# Patient Record
Sex: Male | Born: 1965
Health system: Southern US, Community
[De-identification: ages and names within clinical notes are randomized; demographics above are authoritative.]

## PROBLEM LIST (undated history)

## (undated) DIAGNOSIS — D801 Nonfamilial hypogammaglobulinemia: Secondary | ICD-10-CM

## (undated) DIAGNOSIS — F319 Bipolar disorder, unspecified: Secondary | ICD-10-CM

## (undated) DIAGNOSIS — K219 Gastro-esophageal reflux disease without esophagitis: Secondary | ICD-10-CM

## (undated) DIAGNOSIS — I10 Essential (primary) hypertension: Secondary | ICD-10-CM

## (undated) HISTORY — DX: Bipolar disorder, unspecified: F31.9

## (undated) HISTORY — DX: Nonfamilial hypogammaglobulinemia: D80.1

## (undated) HISTORY — PX: TONSILLECTOMY: SUR1361

---

## 2004-06-19 ENCOUNTER — Emergency Department (HOSPITAL_COMMUNITY): Admission: EM | Admit: 2004-06-19 | Discharge: 2004-06-20 | Payer: Self-pay | Admitting: Emergency Medicine

## 2009-01-20 ENCOUNTER — Encounter: Payer: Self-pay | Admitting: Family Medicine

## 2009-05-30 ENCOUNTER — Ambulatory Visit: Payer: Self-pay | Admitting: Family Medicine

## 2009-05-30 DIAGNOSIS — D801 Nonfamilial hypogammaglobulinemia: Secondary | ICD-10-CM

## 2009-05-30 HISTORY — DX: Nonfamilial hypogammaglobulinemia: D80.1

## 2009-05-31 ENCOUNTER — Encounter: Payer: Self-pay | Admitting: Family Medicine

## 2009-05-31 LAB — CONVERTED CEMR LAB
IgA: 108 mg/dL (ref 68–378)
IgG (Immunoglobin G), Serum: 687 mg/dL — ABNORMAL LOW (ref 694–1618)
IgM, Serum: 62 mg/dL (ref 60–263)

## 2009-07-28 ENCOUNTER — Encounter: Payer: Self-pay | Admitting: Family Medicine

## 2009-11-29 ENCOUNTER — Ambulatory Visit: Payer: Self-pay | Admitting: Family Medicine

## 2009-11-29 DIAGNOSIS — F319 Bipolar disorder, unspecified: Secondary | ICD-10-CM

## 2009-11-29 HISTORY — DX: Bipolar disorder, unspecified: F31.9

## 2009-11-30 LAB — CONVERTED CEMR LAB
ALT: 33 units/L (ref 0–53)
BUN: 14 mg/dL (ref 6–23)
CO2: 28 meq/L (ref 19–32)
Cholesterol: 179 mg/dL (ref 0–200)
Creatinine, Ser: 0.94 mg/dL (ref 0.40–1.50)
Free Thyroxine Index: 3.1 (ref 1.0–3.9)
Glucose, Bld: 95 mg/dL (ref 70–99)
HDL: 43 mg/dL (ref >39)
Total Bilirubin: 0.7 mg/dL (ref 0.3–1.2)
Total CHOL/HDL Ratio: 4.2
Triglycerides: 88 mg/dL (ref <150)
VLDL: 18 mg/dL (ref 0–40)

## 2010-02-21 NOTE — Assessment & Plan Note (Signed)
Summary: CPE   Vital Signs:  Patient profile:   45 year old male Height:      70 inches Weight:      189 pounds BMI:     27.22 O2 Sat:      99 % on Room air Pulse rate:   62 / minute BP sitting:   128 / 88  (left arm) Cuff size:   large  Vitals Entered By: Payton Spark CMA (November 29, 2009 10:36 AM)  O2 Flow:  Room air CC: CPE w/ fasting labs   Primary Care Provider:  Seymour Bars DO  CC:  CPE w/ fasting labs.  History of Present Illness: 45 yo WM presents for CPE.  He has bipolar d/o managed by psychiatry, stable and is due for fasting labs and a lithium level today.  Immunizations UTD.  Sees derm annually for skin check.  Denies fam hx of colon or prostate cancer or of premature heart dz.  Current Medications (verified): 1)  Lamictal 150 Mg Tabs (Lamotrigine) .... Take 1 Tab By Mouth Two Times A Day 2)  Lithium Carbonate 450 Mg Cr-Tabs (Lithium Carbonate) .... Take 1 Tab By Mouth Two Times A Day 3)  Wellbutrin Xl 150 Mg Xr24h-Tab (Bupropion Hcl) .... Take 1 Tab By Mouth Once Daily 4)  Propranolol Hcl 10 Mg Tabs (Propranolol Hcl) .... Take 1 Tab By Mouth Every Morning and 2 Tabs By Mouth Every Evening 5)  Dicyclomine Hcl 20 Mg Tabs (Dicyclomine Hcl) .... Take 1 Tab By Mouth W/ Meals  Allergies (verified): 1)  ! * D.m. Cold Medicines 2)  ! Pcn 3)  ! Steroids 4)  ! Barbiturates  Past History:  Past Medical History: Reviewed history from 05/30/2009 and no changes required. Bipolar D/O since age 44.  psychiatrist: Dr Asher Muir in [place] tremors - on BB  Past Surgical History: Reviewed history from 05/30/2009 and no changes required. tonsillectomy  Family History: Reviewed history from 05/30/2009 and no changes required. father SCC; bipolar Mother healthy brother SCC sister healthy  Social History: Reviewed history from 05/30/2009 and no changes required. 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  Has 5 kids.  4 live with ex- wife and 1 daughter (1 yo) lives with him and  his wife.  Quit smoking in 2000. Walks for exercise.  Review of Systems  The patient denies anorexia, fever, weight loss, weight gain, vision loss, decreased hearing, hoarseness, chest pain, syncope, dyspnea on exertion, peripheral edema, prolonged cough, headaches, hemoptysis, abdominal pain, melena, hematochezia, severe indigestion/heartburn, hematuria, incontinence, genital sores, muscle weakness, suspicious skin lesions, transient blindness, difficulty walking, depression, unusual weight change, abnormal bleeding, enlarged lymph nodes, angioedema, breast masses, and testicular masses.    Physical Exam  General:  alert, well-developed, well-nourished, and well-hydrated.   Head:  normocephalic and atraumatic.   Eyes:  pupils equal, pupils round, and pupils reactive to light.   Ears:  EACs patent; TMs translucent and gray with good cone of light and bony landmarks.  Nose:  no nasal discharge.   Mouth:  good dentition and pharynx pink and moist.   Neck:  no masses.   Lungs:  Normal respiratory effort, chest expands symmetrically. Lungs are clear to auscultation, no crackles or wheezes. Heart:  Normal rate and regular rhythm. S1 and S2 normal without gallop, murmur, click, rub or other extra sounds. Abdomen:  Bowel sounds positive,abdomen soft and non-tender without masses, organomegaly; no AA bruits Pulses:  2+ radial and pedal pulses Extremities:  no LE edema Neurologic:  gait normal.   Skin:  color normal, no rashes, and no suspicious lesions.   Cervical Nodes:  No lymphadenopathy noted Psych:  good eye contact, not anxious appearing, and not depressed appearing.     Impression & Recommendations:  Problem # 1:  HEALTH SCREENING (ICD-V70.0) Keeping healthy checklist for men reviewed. Update fasting labs. BMI 27 = overwt.  BP at goal. F/U with psych for bipolar d/o. MVI daily. healthy diet, regular exercise encouraged.  Complete Medication List: 1)  Lamictal 150 Mg Tabs  (Lamotrigine) .... Take 1 tab by mouth two times a day 2)  Lithium Carbonate 450 Mg Cr-tabs (Lithium carbonate) .... Take 1 tab by mouth two times a day 3)  Wellbutrin Xl 150 Mg Xr24h-tab (Bupropion hcl) .... Take 1 tab by mouth once daily 4)  Propranolol Hcl 10 Mg Tabs (Propranolol hcl) .... Take 1 tab by mouth every morning and 2 tabs by mouth every evening 5)  Dicyclomine Hcl 20 Mg Tabs (Dicyclomine hcl) .... Take 1 tab by mouth w/ meals  Other Orders: T-Lithium Level ([id number]) T-Lipid Profile ([id number]) T-Comprehensive Metabolic Panel [phone]) T-Thyroid Profile [phone])  Patient Instructions: 1)  Stay on current meds. 2)  Labs today. 3)  Will call you w/ results tomorrow. 4)  REturn for follow up in 1 yr, sooner if needed.   Orders Added: 1)  T-Lithium Level [[id number]] 2)  T-Lipid Profile [[id number]] 3)  T-Comprehensive Metabolic Panel [[id number]] 4)  T-Thyroid Profile [phone] 5)  Est. Patient age 70-64 [[id number]]   Immunization History:  Tetanus/Td Immunization History:    Tetanus/Td:  historical (03/22/2009)   Immunization History:  Tetanus/Td Immunization History:    Tetanus/Td:  Historical (03/22/2009)

## 2010-02-21 NOTE — Assessment & Plan Note (Signed)
Summary: NOV abnormal lab   Vital Signs:  Patient profile:   45 year old male Height:      70 inches Weight:      187 pounds BMI:     26.93 O2 Sat:      98 % on Room air Pulse rate:   71 / minute BP sitting:   113 / 75  (left arm) Cuff size:   large  Vitals Entered By: Payton Spark CMA (May 30, 2009 3:37 PM)  O2 Flow:  Room air CC: New to est. Denied life insurance due to ? globulin.    Primary Care Provider:  Seymour Bars DO  CC:  New to est. Denied life insurance due to ? globulin. Marland Kitchen  History of Present Illness: 45 yo WM presents for NOV.  He did not have a PCP.  In Dec, he had labs done thru [facility] and his 'globulin' level was a little low.  Otherwise, everything looked normal.  He is seeing psych in [place] for bipolar disorder.  he does have yearlong allergies.     Current Medications (verified): 1)  Lamictal 150 Mg Tabs (Lamotrigine) .... Take 1 Tab By Mouth Two Times A Day 2)  Lithium Carbonate 450 Mg Cr-Tabs (Lithium Carbonate) .... Take 1 Tab By Mouth Two Times A Day 3)  Wellbutrin Xl 150 Mg Xr24h-Tab (Bupropion Hcl) .... Take 1 Tab By Mouth Once Daily 4)  Propranolol Hcl 10 Mg Tabs (Propranolol Hcl) .... Take 1 Tab By Mouth Every Morning and 2 Tabs By Mouth Every Evening 5)  Dicyclomine Hcl 20 Mg Tabs (Dicyclomine Hcl) .... Take 1 Tab By Mouth W/ Meals  Allergies (verified): 1)  ! * D.m. Cold Medicines 2)  ! Pcn 3)  ! Steroids 4)  ! Barbiturates  Past History:  Past Medical History: Bipolar D/O since age 75.  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  Has 5 kids.  4 live with ex- wife and 1 daughter (1 yo) lives with him and his wife.  Quit smoking in 2000. Walks for exercise.  Review of Systems       no fevers/sweats/weakness, unexplained wt loss/gain, no change in  vision, no difficulty hearing, ringing in ears, no hay fever/allergies, no CP/discomfort, no palpitations, no breast lump/nipple discharge, no cough/wheeze, no blood in stool, no N/V/D, no nocturia, no leaking urine, no unusual vag bleeding, no vaginal/penile discharge, no muscle/joint pain, no rash, no new/changing mole, no HA, no memory loss, no anxiety, no sleep problem, + depression, no unexplained lumps, no easy bruising/bleeding, no concern with sexual function   Physical Exam  General:  alert, well-developed, well-nourished, and well-hydrated.   Head:  normocephalic and atraumatic.   Mouth:  good dentition and pharynx pink and moist.   Neck:  no masses.   Lungs:  Normal respiratory effort, chest expands symmetrically. Lungs are clear to auscultation, no crackles or wheezes. Heart:  Normal rate and regular rhythm. S1 and S2 normal without gallop, murmur, click, rub or other extra sounds. Skin:  color normal.   Cervical Nodes:  No lymphadenopathy noted Psych:  good eye contact, not anxious appearing, and not depressed appearing.     Impression & Recommendations:  Problem # 1:  UNSPECIFIED HYPOGAMMAGLOBULINEMIA (ICD-279.00) Globulin borderline low.  Recheck Quant IGs today to send back to [facility].  Pt  is otherwise healthy and normal and his CMP/ FLP were normal.   Orders: T-Immunoglobulins, Quantitative ([id number])  Complete Medication List: 1)  Lamictal 150 Mg Tabs (Lamotrigine) .... Take 1 tab by mouth two times a day 2)  Lithium Carbonate 450 Mg Cr-tabs (Lithium carbonate) .... Take 1 tab by mouth two times a day 3)  Wellbutrin Xl 150 Mg Xr24h-tab (Bupropion hcl) .... Take 1 tab by mouth once daily 4)  Propranolol Hcl 10 Mg Tabs (Propranolol hcl) .... Take 1 tab by mouth every morning and 2 tabs by mouth every evening 5)  Dicyclomine Hcl 20 Mg Tabs (Dicyclomine hcl) .... Take 1 tab by mouth w/ meals  Patient Instructions: 1)  Lab downstairs today. 2)  Will call you w/ results in  2-3 days. 3)  Will send you a copy to send to [facility]. 4)  Return for a PHYSICAL in 6 mos.

## 2010-02-21 NOTE — Letter (Signed)
Summary: [facility]   Imported By: Lanelle Bal 08/09/2009 14:34:03  _____________________________________________________________________  External Attachment:    Type:   Image     Comment:   External Document

## 2010-07-19 ENCOUNTER — Telehealth: Payer: Self-pay | Admitting: Family Medicine

## 2010-07-19 NOTE — Telephone Encounter (Signed)
[facility] Jasmine December) called and pt was seen in the [facility]) weeks ago.  They are calling for CD and xray report that we should have from the [facility].   Plan:  Called and told Jasmine December that Payton Spark, CMA asst for Dr. Cathey Endow has not seen those reports come across.  Told them they would need to call the [facility] to retrieve them. Jarvis Newcomer, LPN Dom[facility] Dimes

## 2012-04-09 ENCOUNTER — Ambulatory Visit (INDEPENDENT_AMBULATORY_CARE_PROVIDER_SITE_OTHER): Payer: BC Managed Care – PPO | Admitting: Family Medicine

## 2012-04-09 ENCOUNTER — Encounter: Payer: Self-pay | Admitting: Family Medicine

## 2012-04-09 VITALS — BP 117/70 | HR 80 | Temp 97.9°F | Wt 190.0 lb

## 2012-04-09 DIAGNOSIS — IMO0002 Reserved for concepts with insufficient information to code with codable children: Secondary | ICD-10-CM

## 2012-04-09 DIAGNOSIS — R3589 Other polyuria: Secondary | ICD-10-CM | POA: Insufficient documentation

## 2012-04-09 DIAGNOSIS — R358 Other polyuria: Secondary | ICD-10-CM

## 2012-04-09 DIAGNOSIS — S76219A Strain of adductor muscle, fascia and tendon of unspecified thigh, initial encounter: Secondary | ICD-10-CM

## 2012-04-09 DIAGNOSIS — L738 Other specified follicular disorders: Secondary | ICD-10-CM

## 2012-04-09 DIAGNOSIS — L739 Follicular disorder, unspecified: Secondary | ICD-10-CM

## 2012-04-09 MED ORDER — SULFAMETHOXAZOLE-TRIMETHOPRIM 800-160 MG PO TABS: ORAL_TABLET | ORAL | Status: AC

## 2012-04-09 NOTE — Progress Notes (Signed)
CC: Roger Gonzalez is a 47 y.o. male is here for Groin Pain and Urinary Frequency   Subjective: HPI:  Patient complains of urinary frequency. This is been present for matter of years. It has not gotten better or worse since onset. Onset was gradual. Symptoms are worse with increased fluids during the day. Symptoms improved but he drinks less than 1 gallon of water a day. He admits to drinking 1 gallon of water most days of the week. He denies week stream, sensation of incomplete voiding, hesitancy, dysuria, denies ever waking even once to urinate at night.  Patient complains of left groin pain is been present for 2-3 months. Symptoms came on gradually without any precipitating event. They are worse when doing stomach crunches or bearing down to have a hard bowel movement. Denies pain with coughing, sneezing, laughing, nor lifting objects greater than 25 pounds. Denies swelling in the groin nor testicular pain nor testicular swelling. Symptoms come and go without warning on a weekly basis. Symptoms are mild in severity at the maximum.  Patient complains of perirectal discomfort described as a soreness and rawness. It is worse when wiping his bottom, he sometimes notices scant streaks of bright red blood" paper the more he wipes. The above discomfort is described as mild/moderate in severity and worse when the pararectal area is moist or when sitting for long periods of time. He's never had this before. Interventions have included coconut oil without much improvement of discomfort. He denies painful bowel movements nor blood or tar-like stools. Denies recent or remote trauma to pararectal area  Denies fevers, chills, nausea, abdominal pain, pelvic pain  Review Of Systems Outlined In HPI  Past Medical History  Diagnosis Date  . BIPOLAR DISORDER UNSPECIFIED 11/29/2009  . UNSPECIFIED HYPOGAMMAGLOBULINEMIA 05/30/2009    Qualifier: Diagnosis of  By: Thomos Lemons       History reviewed. No  pertinent family history.   History  Substance Use Topics  . Smoking status: Not on file  . Smokeless tobacco: Not on file  . Alcohol Use: Not on file     Objective: Filed Vitals:   04/09/12 1617  BP: 117/70  Pulse: 80  Temp: 97.9 F (36.6 C)    General: Alert and Oriented, No Acute Distress HEENT: Pupils equal, round, reactive to light. Conjunctivae clear.  Moist mucous membranes Lungs: Clear to auscultation bilaterally, no wheezing/ronchi/rales.  Comfortable work of breathing. Good air movement. Cardiac: Regular rate and rhythm. Normal S1/S2.  No murmurs, rubs, nor gallops.   Abdomen: Soft and nontender Genitourinary: No testicular swelling or tenderness, no palpable mass or bulge in the left inguinal canal, no bulging nor masses in or around the left inguinal canal with Valsalva Rectal: External rectal exam shows trace to mild folliculitis 1-2 cm just anterior to the anus without abnormalities otherwise Extremities: No peripheral edema.  Strong peripheral pulses.  Mental Status: No depression, anxiety, nor agitation. Skin: Warm and dry.  Assessment & Plan: Roger Gonzalez was seen today for groin pain and urinary frequency.  Diagnoses and associated orders for this visit:  Folliculitis - sulfamethoxazole-trimethoprim (SEPTRA DS) 800-160 MG per tablet; One by mouth twice a day for ten days.  Polyuria - Cancel: BASIC METABOLIC PANEL WITH GFR  Groin strain, initial encounter  Other Orders - BuPROPion HCl (WELLBUTRIN PO); Take by mouth. - LITHIUM CARBONATE PO; Take by mouth. - PROPRANOLOL HCL PO; Take by mouth.    Folliculitis: Discussed with patient that perirectal folliculitis most likely causing his discomfort,  treatment would include keeping area clean and dry along with starting Bactrim.Signs and symptoms requring emergent/urgent reevaluation were discussed with the patient. Polyuria: Encouraged patient to have urinalysis and basic metabolic panel to rule out  hyperglycemia, patient would prefer to wait for CPE, discussed possible side effect of lithium Groin strain: Patient was given a handout and exercises were demonstrated to help him with home rehabilitation plan  Return in about 4 weeks (around 05/07/2012) for cpe.

## 2012-04-23 ENCOUNTER — Telehealth: Payer: Self-pay | Admitting: Family Medicine

## 2012-04-23 ENCOUNTER — Encounter: Payer: BC Managed Care – PPO | Admitting: Family Medicine

## 2012-04-23 DIAGNOSIS — Z79899 Other long term (current) drug therapy: Secondary | ICD-10-CM | POA: Insufficient documentation

## 2012-04-23 DIAGNOSIS — Z13220 Encounter for screening for lipoid disorders: Secondary | ICD-10-CM

## 2012-04-23 DIAGNOSIS — R358 Other polyuria: Secondary | ICD-10-CM

## 2012-04-23 NOTE — Telephone Encounter (Signed)
[place], Labs regarding annual physical and screening for lithium side effects placed at front desk.  These should be obtained fasting for most accurate results.

## 2012-05-02 LAB — URINALYSIS
Bilirubin Urine: NEGATIVE
Glucose, UA: NEGATIVE mg/dL
Hgb urine dipstick: NEGATIVE
Ketones, ur: NEGATIVE mg/dL
Protein, ur: NEGATIVE mg/dL

## 2012-05-02 LAB — CBC
MCV: 87.6 fL (ref 78.0–100.0)
Platelets: 172 10*3/uL (ref 150–400)
RBC: 5.39 MIL/uL (ref 4.22–5.81)
WBC: 5.2 10*3/uL (ref 4.0–10.5)

## 2012-05-02 LAB — COMPLETE METABOLIC PANEL WITH GFR
CO2: 26 mEq/L (ref 19–32)
Calcium: 9.4 mg/dL (ref 8.4–10.5)
Chloride: 108 mEq/L (ref 96–112)
Creat: 0.96 mg/dL (ref 0.50–1.35)
GFR, Est African American: >89 mL/min
GFR, Est Non African American: >89 mL/min
Glucose, Bld: 97 mg/dL (ref 70–99)
Total Bilirubin: 0.6 mg/dL (ref 0.3–1.2)

## 2012-05-02 LAB — LIPID PANEL
LDL Cholesterol: 132 mg/dL — ABNORMAL HIGH (ref 0–99)
Total CHOL/HDL Ratio: 4.9 Ratio
VLDL: 13 mg/dL (ref 0–40)

## 2012-05-04 LAB — THYROID PANEL WITH TSH
Free Thyroxine Index: 4.1 — ABNORMAL HIGH (ref 1.0–3.9)
T3 Uptake: 36.3 % (ref 22.5–37.0)
T4, Total: 11.2 ug/dL (ref 5.0–12.5)
TSH: 0.929 u[IU]/mL (ref 0.350–4.500)

## 2012-05-05 ENCOUNTER — Encounter: Payer: Self-pay | Admitting: Family Medicine

## 2012-05-05 ENCOUNTER — Ambulatory Visit (INDEPENDENT_AMBULATORY_CARE_PROVIDER_SITE_OTHER): Payer: BC Managed Care – PPO | Admitting: Family Medicine

## 2012-05-05 VITALS — BP 104/64 | HR 82 | Ht 70.0 in | Wt 188.0 lb

## 2012-05-05 DIAGNOSIS — Z0000 Encounter for general adult medical examination without abnormal findings: Secondary | ICD-10-CM

## 2012-05-05 DIAGNOSIS — E785 Hyperlipidemia, unspecified: Secondary | ICD-10-CM | POA: Insufficient documentation

## 2012-05-05 NOTE — Progress Notes (Signed)
CC: Roger Gonzalez is a 47 y.o. male is here for Annual Exam   Subjective: HPI:  Colonoscopy: IBS workup, this occured within last 10 years, he denies any abnormalities on this procedure. Denies recent or remote car appearance stool nor blood in stool. Prostate: Discussed screening risks/beneifts with patient on 05/05/2012. No personal or family history of prostate cancer  Influenza Vaccine:  Out of season Pneumovax: not indicated until 28 Td/Tdap: Td 2011 therefore up-to-date Zoster: Not indicated until 35  Patient presents for complete physical exam without any complaints today.  Exercises on a daily basis, eats a healthy varied diet of fruits and vegetables with whole grains. Denies alcohol, tobacco, nor recreational drug use.  Over the past 2 weeks have you been bothered by: - Little interest or pleasure in doing things: No - Feeling down depressed or hopeless: No  Review of Systems - General ROS: negative for - chills, fever, night sweats, weight gain or weight loss Ophthalmic ROS: negative for - decreased vision Psychological ROS: negative for - anxiety or depression ENT ROS: negative for - hearing change, nasal congestion, tinnitus or allergies Hematological and Lymphatic ROS: negative for - bleeding problems, bruising or swollen lymph nodes Breast ROS: negative Respiratory ROS: no cough, shortness of breath, or wheezing Cardiovascular ROS: no chest pain or dyspnea on exertion Gastrointestinal ROS: no abdominal pain, change in bowel habits, or black or bloody stools Genito-Urinary ROS: negative for - genital discharge, genital ulcers, incontinence or abnormal bleeding from genitals Musculoskeletal ROS: negative for - joint pain or muscle pain Neurological ROS: negative for - headaches or memory loss Dermatological ROS: negative for lumps, mole changes, rash and skin lesion changes  Past Medical History  Diagnosis Date  . BIPOLAR DISORDER UNSPECIFIED 11/29/2009  .  UNSPECIFIED HYPOGAMMAGLOBULINEMIA 05/30/2009    Qualifier: Diagnosis of  By: Cathey Endow DO, Karen       No family history on file.   History  Substance Use Topics  . Smoking status: Former Games developer  . Smokeless tobacco: Former Neurosurgeon    Quit date: 05/05/2000  . Alcohol Use: No     Objective: Filed Vitals:   05/05/12 1558  BP: 104/64  Pulse: 82    General: No Acute Distress HEENT: Atraumatic, normocephalic, conjunctivae normal without scleral icterus.  No nasal discharge, hearing grossly intact, TMs with good landmarks bilaterally with no middle ear abnormalities, posterior pharynx clear without oral lesions. Neck: Supple, trachea midline, no cervical nor supraclavicular adenopathy. Pulmonary: Clear to auscultation bilaterally without wheezing, rhonchi, nor rales. Cardiac: Regular rate and rhythm.  No murmurs, rubs, nor gallops. No peripheral edema.  2+ peripheral pulses bilaterally. Abdomen: Bowel sounds normal.  No masses.  Non-tender without rebound.  Negative Murphy's sign. GU: Bilateral descended non-tender testicles  MSK: Grossly intact, no signs of weakness.  Full strength throughout upper and lower extremities.  Full ROM in upper and lower extremities.  No midline spinal tenderness. Neuro: Gait unremarkable, CN II-XII grossly intact.  C5-C6 Reflex 2/4 Bilaterally, L4 Reflex 2/4 Bilaterally.  Cerebellar function intact. Skin: No rashes. Psych: Alert and oriented to person/place/time.  Thought process normal. No anxiety/depression.  Assessment & Plan: Roger Gonzalez was seen today for annual exam.  Diagnoses and associated orders for this visit:  Physical exam, annual  Other Orders - LamoTRIgine (LAMICTAL PO); Take by mouth.    We reviewed his labs from earlier this week. Cholesterol at goal LDL less than 160. Metabolic panel without abnormalities. Thyroid function is normal monitored for Li use.  Urinalysis reviewed and polyuria most likely do to lithium. Healthy lifestyle  interventions including but limited to regular exercise, a healthy low fat diet, moderation of salt intake, the dangers of tobacco/alcohol/recreational drug use, nutrition supplementation, and accident avoidance were discussed with the patient and a handout was provided for future reference. Discussed benefits of testicular exam.    Return in about 1 year (around 05/05/2013).

## 2012-05-05 NOTE — Patient Instructions (Addendum)
Dr. Shatima Zalar's General Advice Following Your Complete Physical Exam  The Benefits of Regular Exercise: Unless you suffer from an uncontrolled cardiovascular condition, studies strongly suggest that regular exercise and physical activity will add to both the quality and length of your life.  The World Health Organization recommends 150 minutes of moderate intensity aerobic activity every week.  This is best split over 3-4 days a week, and can be as simple as a brisk walk for just over 35 minutes "most days of the week".  This type of exercise has been shown to lower LDL-Cholesterol, lower average blood sugars, lower blood pressure, lower cardiovascular disease risk, improve memory, and increase one's overall sense of wellbeing.  The addition of anaerobic (or "strength training") exercises offers additional benefits including but not limited to increased metabolism, prevention of osteoporosis, and improved overall cholesterol levels.  How Can I Strive For A Low-Fat Diet?: Current guidelines recommend that 25-35 percent of your daily energy (food) intake should come from fats.  One might ask how can this be achieved without having to dissect each meal on a daily basis?  Switch to skim or 1% milk instead of whole milk.  Focus on lean meats such as ground turkey, fresh fish, baked chicken, and lean cuts of beef as your source of dietary protein.  Consume less than 300mg/day of dietary cholesterol.  Limit trans fatty acid consumption primarily by limiting synthetic trans fats such as partially hydrogenated oils (Ex: fried fast foods).  Focus efforts on reducing your intake of "solid" fats (Ex: Butter).  Substitute olive or vegetable oil for solid fats where possible.  Moderation of Salt Intake: Provided you don't carry a diagnosis of congestive heart failure nor renal failure, I recommend a daily allowance of no more than 2300 mg of salt (sodium).  Keeping under this daily goal is associated with a  decreased risk of cardiovascular events, creeping above it can lead to elevated blood pressures and increases your risk of cardiovascular events.  Milligrams (mg) of salt is listed on all nutrition labels, and your daily intake can add up faster than you think.  Most canned and frozen dinners can pack in over half your daily salt allowance in one meal.    Lifestyle Health Risks: Certain lifestyle choices carry specific health risks.  As you may already know, tobacco use has been associated with increasing one's risk of cardiovascular disease, pulmonary disease, numerous cancers, among many other issues.  What you may not know is that there are medications and nicotine replacement strategies that can more than double your chances of successfully quitting.  I would be thrilled to help manage your quitting strategy if you currently use tobacco products.  When it comes to alcohol use, I've yet to find an "ideal" daily allowance.  Provided an individual does not have a medical condition that is exacerbated by alcohol consumption, general guidelines determine "safe drinking" as no more than two standard drinks for a man or no more than one standard drink for a male per day.  However, much debate still exists on whether any amount of alcohol consumption is technically "safe".  My general advice, keep alcohol consumption to a minimum for general health promotion.  If you or others believe that alcohol, tobacco, or recreational drug use is interfering with your life, I would be happy to provide confidential counseling regarding treatment options.  General "Over The Counter" Nutrition Advice: Postmenopausal women should aim for a daily calcium intake of 1200 mg, however a significant   portion of this might already be provided by diets including milk, yogurt, cheese, and other dairy products.  Vitamin D has been shown to help preserve bone density, prevent fatigue, and has even been shown to help reduce falls in the  elderly.  Ensuring a daily intake of 800 Units of Vitamin D is a good place to start to enjoy the above benefits, we can easily check your Vitamin D level to see if you'd potentially benefit from supplementation beyond 800 Units a day.  Folic Acid intake should be of particular concern to women of childbearing age.  Daily consumption of 400-800 mcg of Folic Acid is recommended to minimize the chance of spinal cord defects in a fetus should pregnancy occur.    For many adults, accidents still remain one of the most common culprits when it comes to cause of death.  Some of the simplest but most effective preventitive habits you can adopt include regular seatbelt use, proper helmet use, securing firearms, and regularly testing your smoke and carbon monoxide detectors.  Roger Gonzalez B. Estephanie Hubbs DO [facility] [address], [address] [place], [place] [address] Phone: [phone]  

## 2012-05-07 ENCOUNTER — Encounter: Payer: Self-pay | Admitting: Family Medicine

## 2012-05-07 DIAGNOSIS — F3160 Bipolar disorder, current episode mixed, unspecified: Secondary | ICD-10-CM | POA: Insufficient documentation

## 2012-08-28 ENCOUNTER — Other Ambulatory Visit: Payer: Self-pay | Admitting: Family Medicine

## 2012-08-28 ENCOUNTER — Encounter: Payer: Self-pay | Admitting: Family Medicine

## 2012-08-28 ENCOUNTER — Ambulatory Visit (INDEPENDENT_AMBULATORY_CARE_PROVIDER_SITE_OTHER): Payer: BC Managed Care – PPO | Admitting: Family Medicine

## 2012-08-28 DIAGNOSIS — R7309 Other abnormal glucose: Secondary | ICD-10-CM

## 2012-08-28 DIAGNOSIS — R4182 Altered mental status, unspecified: Secondary | ICD-10-CM

## 2012-08-28 DIAGNOSIS — H53139 Sudden visual loss, unspecified eye: Secondary | ICD-10-CM

## 2012-08-28 DIAGNOSIS — H53133 Sudden visual loss, bilateral: Secondary | ICD-10-CM

## 2012-08-28 DIAGNOSIS — T56894A Toxic effect of other metals, undetermined, initial encounter: Secondary | ICD-10-CM

## 2012-08-28 DIAGNOSIS — R739 Hyperglycemia, unspecified: Secondary | ICD-10-CM

## 2012-08-28 NOTE — Progress Notes (Signed)
CC: Roger Gonzalez is a 47 y.o. male is here for hospital f/u   Subjective: HPI:  Hospital followup for altered mental status. Patient complains of irritability, confusion, trouble concentrating that began Friday of last week slowly worsened until Sunday passed out at church. Describes extreme fatigue diarrhea and upset stomach Sunday night woke up Monday morning acting incoherent and acting inappropriately per his wife. At that time patient was disoriented to person place and time. He was taken to the emergency room early Monday morning where he had a normal CT scan of the head, normal chest x-ray, normal EKG, complete metabolic panel significant only for glucose 111, unremarkable CBC. Urine drug screen was negative. Valproic acid level was 60 lithium level was 1.56. I cannot get a straight answer from him as to when he usually takes his evening dose of lithium. He believes his lithium level was last checked about a month ago by his psychiatrist. Since his emergency room visit his psychiatrist has stopped Depakote and started Wellbutrin. Patient complains of continued vision issues described as an inability to focus, symptoms get worse the longer he focuses on objects. Tasks at work are taking up to 5-6 times longer due to his vision loss. He describes loss only as a blurring denies peripheral vision issues or focal vision loss. Vision loss and irritability seem to be worse at work improves slightly at home. Nothing else makes better or worse other than above. When looking at stationary objects he gets the sense that they are moving. He denies hallucinations.  Denies fevers, chills, nausea, vomiting, chest pain, shortness of breath, motor or sensory disturbances other than that described above, paranoia, anxiety nor depression.   Review Of Systems Outlined In HPI  Past Medical History  Diagnosis Date  . BIPOLAR DISORDER UNSPECIFIED 11/29/2009  . 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   History  Substance Use Topics  . Smoking status: Former Games developer  . Smokeless tobacco: Former Neurosurgeon    Quit date: 05/05/2000  . Alcohol Use: No     Objective: Filed Vitals:   08/28/12 1125  BP: 119/78  Pulse: 69    General: Alert and Oriented, No Acute Distress HEENT: Pupils equal, round, reactive to light. Conjunctivae clear.  Moist mucous membranes pharynx unremarkable Neuro: Cranial nerves II through XII grossly intact Lungs: Clear to auscultation bilaterally, no wheezing/ronchi/rales.  Comfortable work of breathing. Good air movement. Cardiac: Regular rate and rhythm. Normal S1/S2.  No murmurs, rubs, nor gallops.   Abdomen: Soft nontender Extremities: No peripheral edema.  Strong peripheral pulses.  Mental Status: No depression, anxiety, nor agitation. Skin: Warm and dry.  Assessment & Plan: Roger Gonzalez was seen today for hospital f/u.  Diagnoses and associated orders for this visit:  Lithium toxicity, initial encounter  Altered mental state - Valproic Acid level - Ammonia - COMPLETE METABOLIC PANEL WITH GFR  Vision, loss, sudden, bilateral - Valproic Acid level - Hemoglobin A1c - COMPLETE METABOLIC PANEL WITH GFR - Ambulatory referral to Ophthalmology  Hyperglycemia - Hemoglobin A1c - COMPLETE METABOLIC PANEL WITH GFR  Valproic acid toxicity, initial encounter - Ambulatory referral to Ophthalmology    Altered mental status: Improving it does not sound like he is back to its 100% baseline, checking valproic acid level and ammonia level, screen for metabolic abnormality. I do not feel that he is a threat to himself for psychological standpoint her mental status  standpoint. Vision loss: Given subjective and objective vision today I have refer him urgently to a local ophthalmologist for complete eye exam, he did have hyperglycemia at the hospital will rule out type 2 diabetes Lithium  toxicity: His level at the hospital was above 1.5  therefore toxic. However there is some uncertainty how many hours had passed since his last dose. He tells me he has had this discussion with his psychiatrist on Monday and that she felt comfortable that this was due to checking his level to close to a dosage  40 minutes spent face-to-face during visit today of which at least 50% was counseling or coordinating care regarding altered mental status, valproic acid toxicity, lithium toxicity, hyperglycemia, vision loss   Return in about 4 weeks (around 09/25/2012).

## 2012-08-29 ENCOUNTER — Telehealth: Payer: Self-pay | Admitting: *Deleted

## 2012-08-29 LAB — COMPLETE METABOLIC PANEL WITH GFR
Alkaline Phosphatase: 58 U/L (ref 39–117)
BUN: 10 mg/dL (ref 6–23)
CO2: 28 mEq/L (ref 19–32)
Creat: 0.98 mg/dL (ref 0.50–1.35)
GFR, Est African American: >89 mL/min
GFR, Est Non African American: >89 mL/min
Glucose, Bld: 88 mg/dL (ref 70–99)
Sodium: 141 mEq/L (ref 135–145)
Total Bilirubin: 0.4 mg/dL (ref 0.3–1.2)
Total Protein: 6 g/dL (ref 6.0–8.3)

## 2012-08-29 LAB — AMMONIA: Ammonia: 43 umol/L (ref 16–53)

## 2012-08-29 NOTE — Telephone Encounter (Signed)
Pt called called and left two messages on my vm one at 8:13 and one at 9:17. I called the number left on my vm after both phone calls. The first message was very unclear and I wasn't able to understand anything the pt said except "I need to get in ttoday." His speech was very slurred. I didn't even know who pt was. The second message at 9:17 was fowarded to me by Anne Hahn because she couldn't understand him either but she figured out his name. Pt called again at 4:00 and left a message stating that he has been trying to call me all day and hasnt received a return phone call and it was very imperative that I call back. I called pt back at 4:40 .I did state to pt that I tried calling him twice and each time I left a message on his vm and that I could not understand what he was saying. He then apologized and asked about his lab results. I advised him of his lab results. Pt wanted to know what he should do because "he needs help." I advised him to call his psychiatrist. Pt voiced understanding.

## 2013-08-13 ENCOUNTER — Encounter: Payer: Self-pay | Admitting: Family Medicine

## 2013-08-13 DIAGNOSIS — R0681 Apnea, not elsewhere classified: Secondary | ICD-10-CM | POA: Insufficient documentation

## 2013-10-08 DIAGNOSIS — G4710 Hypersomnia, unspecified: Secondary | ICD-10-CM | POA: Insufficient documentation

## 2013-10-08 DIAGNOSIS — G4733 Obstructive sleep apnea (adult) (pediatric): Secondary | ICD-10-CM | POA: Insufficient documentation

## 2013-12-22 ENCOUNTER — Encounter: Payer: Self-pay | Admitting: Family Medicine

## 2013-12-22 ENCOUNTER — Ambulatory Visit (INDEPENDENT_AMBULATORY_CARE_PROVIDER_SITE_OTHER): Payer: BC Managed Care – PPO | Admitting: Family Medicine

## 2013-12-22 VITALS — BP 120/68 | HR 64 | Ht 70.0 in | Wt 189.0 lb

## 2013-12-22 DIAGNOSIS — Z79899 Other long term (current) drug therapy: Secondary | ICD-10-CM

## 2013-12-22 DIAGNOSIS — Z0000 Encounter for general adult medical examination without abnormal findings: Secondary | ICD-10-CM | POA: Diagnosis not present

## 2013-12-22 DIAGNOSIS — E785 Hyperlipidemia, unspecified: Secondary | ICD-10-CM

## 2013-12-22 DIAGNOSIS — R42 Dizziness and giddiness: Secondary | ICD-10-CM

## 2013-12-22 NOTE — Progress Notes (Signed)
CC: Roger Gonzalez is a 48 y.o. male is here for Annual Exam   Subjective: HPI:  Colonoscopy: We'll begin screening at age 48 Prostate: We'll begin screening at age 48  Influenza Vaccine: Declined Pneumovax: No current indication Td/Tdap: Up-to-date Zoster: (Start 47 yo)  Requesting complete physical exam with a complaint of dizziness that has been present for at least a year now occurring on a daily basis severe in severity. It can occur at rest, with exertion in any environment and nothing particularly precipitates that he knows of. Symptoms will last for a matter of minutes and resolved without any particular intervention. Symptoms have not been getting better or worse since onset. It is accompanied by double vision when dizziness is present. Nothing seems to make it better or worse.  No alcohol tobacco or recreational drug use  Review of Systems - General ROS: negative for - chills, fever, night sweats, weight gain or weight loss Ophthalmic ROS: negative for - decreased vision Psychological ROS: negative for - anxiety or depression ENT ROS: negative for - hearing change, nasal congestion, tinnitus or allergies Hematological and Lymphatic ROS: negative for - bleeding problems, bruising or swollen lymph nodes Breast ROS: negative Respiratory ROS: no cough, shortness of breath, or wheezing Cardiovascular ROS: no chest pain or dyspnea on exertion Gastrointestinal ROS: no abdominal pain, change in bowel habits, or black or bloody stools Genito-Urinary ROS: negative for - genital discharge, genital ulcers, incontinence or abnormal bleeding from genitals Musculoskeletal ROS: negative for - joint pain or muscle pain Neurological ROS: negative for - headaches or memory loss Dermatological ROS: negative for lumps, mole changes, rash and skin lesion changes  Past Medical History  Diagnosis Date  . BIPOLAR DISORDER UNSPECIFIED 11/29/2009  . 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 No family history on file.  History   Social History  . Marital Status: Married    Spouse Name: N/A    Number of Children: N/A  . Years of Education: N/A   Occupational History  . Not on file.   Social History Main Topics  . Smoking status: Former Games developermoker  . Smokeless tobacco: Former NeurosurgeonUser    Quit date: 05/05/2000  . Alcohol Use: No  . Drug Use: No  . Sexual Activity: Not on file   Other Topics Concern  . Not on file   Social History Narrative     Objective: BP 120/68 mmHg  Pulse 64  Ht 5\' 10"  (1.778 m)  Wt 189 lb (85.73 kg)  BMI 27.12 kg/m2  General: No Acute Distress HEENT: Atraumatic, normocephalic, conjunctivae normal without scleral icterus.  No nasal discharge, hearing grossly intact, TMs with good landmarks bilaterally with no middle ear abnormalities, posterior pharynx clear without oral lesions. Neck: Supple, trachea midline, no cervical nor supraclavicular adenopathy. Pulmonary: Clear to auscultation bilaterally without wheezing, rhonchi, nor rales. Cardiac: Regular rate and rhythm.  No murmurs, rubs, nor gallops. No peripheral edema.  2+ peripheral pulses bilaterally. Abdomen: Bowel sounds normal.  No masses.  Non-tender without rebound.  Negative Murphy's sign. MSK: Grossly intact, no signs of weakness.  Full strength throughout upper and lower extremities.  Full ROM in upper and lower extremities.  No midline spinal tenderness. Neuro: Gait unremarkable, CN II-XII grossly intact.  C5-C6 Reflex 2/4 Bilaterally, L4 Reflex 2/4 Bilaterally.  Cerebellar function intact. Skin: No rashes. Psych: Alert and oriented to person/place/time.  Thought  process normal. No anxiety/depression.  Assessment & Plan: Earlene PlaterWallace was seen today for annual exam.  Diagnoses and associated orders for this visit:  Hyperlipidemia with target LDL less than 160  Annual physical exam - Lipid  panel - CBC - COMPLETE METABOLIC PANEL WITH GFR - TSH - Lithium level  Lithium use - TSH - Lithium level  Dizziness - Ambulatory referral to Neurology    Healthy lifestyle interventions including but not limited to regular exercise, a healthy low fat diet, moderation of salt intake, the dangers of tobacco/alcohol/recreational drug use, nutrition supplementation, and accident avoidance were discussed with the patient and a handout was provided for future reference.  Rule out metabolic component to dizziness thyroid abnormality or lithium toxicity. If all of the above is unremarkable referral to neurology for specialist opinion.   Return in about 1 year (around 12/23/2014) for annual exams.

## 2013-12-22 NOTE — Patient Instructions (Signed)
Dr. Jehieli Brassell's General Advice Following Your Complete Physical Exam  The Benefits of Regular Exercise: Unless you suffer from an uncontrolled cardiovascular condition, studies strongly suggest that regular exercise and physical activity will add to both the quality and length of your life.  The World Health Organization recommends 150 minutes of moderate intensity aerobic activity every week.  This is best split over 3-4 days a week, and can be as simple as a brisk walk for just over 35 minutes "most days of the week".  This type of exercise has been shown to lower LDL-Cholesterol, lower average blood sugars, lower blood pressure, lower cardiovascular disease risk, improve memory, and increase one's overall sense of wellbeing.  The addition of anaerobic (or "strength training") exercises offers additional benefits including but not limited to increased metabolism, prevention of osteoporosis, and improved overall cholesterol levels.  How Can I Strive For A Low-Fat Diet?: Current guidelines recommend that 25-35 percent of your daily energy (food) intake should come from fats.  One might ask how can this be achieved without having to dissect each meal on a daily basis?  Switch to skim or 1% milk instead of whole milk.  Focus on lean meats such as ground turkey, fresh fish, baked chicken, and lean cuts of beef as your source of dietary protein.  Limit saturated fat consumption to less than 10% of your daily caloric intake.  Limit trans fatty acid consumption primarily by limiting synthetic trans fats such as partially hydrogenated oils (Ex: fried fast foods).  Substitute olive or vegetable oil for solid fats where possible.  Moderation of Salt Intake: Provided you don't carry a diagnosis of congestive heart failure nor renal failure, I recommend a daily allowance of no more than 2300 mg of salt (sodium).  Keeping under this daily goal is associated with a decreased risk of cardiovascular events, creeping  above it can lead to elevated blood pressures and increases your risk of cardiovascular events.  Milligrams (mg) of salt is listed on all nutrition labels, and your daily intake can add up faster than you think.  Most canned and frozen dinners can pack in over half your daily salt allowance in one meal.    Lifestyle Health Risks: Certain lifestyle choices carry specific health risks.  As you may already know, tobacco use has been associated with increasing one's risk of cardiovascular disease, pulmonary disease, numerous cancers, among many other issues.  What you may not know is that there are medications and nicotine replacement strategies that can more than double your chances of successfully quitting.  I would be thrilled to help manage your quitting strategy if you currently use tobacco products.  When it comes to alcohol use, I've yet to find an "ideal" daily allowance.  Provided an individual does not have a medical condition that is exacerbated by alcohol consumption, general guidelines determine "safe drinking" as no more than two standard drinks for a man or no more than one standard drink for a male per day.  However, much debate still exists on whether any amount of alcohol consumption is technically "safe".  My general advice, keep alcohol consumption to a minimum for general health promotion.  If you or others believe that alcohol, tobacco, or recreational drug use is interfering with your life, I would be happy to provide confidential counseling regarding treatment options.  General "Over The Counter" Nutrition Advice: Postmenopausal women should aim for a daily calcium intake of 1200 mg, however a significant portion of this might already be   provided by diets including milk, yogurt, cheese, and other dairy products.  Vitamin D has been shown to help preserve bone density, prevent fatigue, and has even been shown to help reduce falls in the elderly.  Ensuring a daily intake of 800 Units of  Vitamin D is a good place to start to enjoy the above benefits, we can easily check your Vitamin D level to see if you'd potentially benefit from supplementation beyond 800 Units a day.  Folic Acid intake should be of particular concern to women of childbearing age.  Daily consumption of 400-800 mcg of Folic Acid is recommended to minimize the chance of spinal cord defects in a fetus should pregnancy occur.    For many adults, accidents still remain one of the most common culprits when it comes to cause of death.  Some of the simplest but most effective preventitive habits you can adopt include regular seatbelt use, proper helmet use, securing firearms, and regularly testing your smoke and carbon monoxide detectors.  Roger Gonzalez B. Roger Gladwin DO [facility] [address], [address] [place], [place] [address] Phone: [phone]  

## 2014-02-05 ENCOUNTER — Ambulatory Visit (INDEPENDENT_AMBULATORY_CARE_PROVIDER_SITE_OTHER): Payer: BLUE CROSS/BLUE SHIELD | Admitting: Neurology

## 2014-02-05 ENCOUNTER — Encounter: Payer: Self-pay | Admitting: Neurology

## 2014-02-05 ENCOUNTER — Telehealth: Payer: Self-pay | Admitting: Neurology

## 2014-02-05 VITALS — BP 102/70 | HR 59 | Resp 16 | Ht 70.0 in | Wt 192.0 lb

## 2014-02-05 DIAGNOSIS — F411 Generalized anxiety disorder: Secondary | ICD-10-CM

## 2014-02-05 DIAGNOSIS — R251 Tremor, unspecified: Secondary | ICD-10-CM

## 2014-02-05 NOTE — Telephone Encounter (Signed)
Pt cant make appt today due to car trouble and resch appt to 03-22-14

## 2014-02-05 NOTE — Progress Notes (Signed)
NEUROLOGY CONSULTATION NOTE  Roger Gonzalez MRN: [id number] DOB: 1965/07/13  Referring provider: Dr. Laren Boom Primary care provider: Dr. Laren Boom  Reason for consult:  Dizziness, tremors  Dear Dr Ivan Anchors:  Thank you for your kind referral of ISAC LINCKS for consultation of the above symptoms. Although his history is well known to you, please allow me to reiterate it for the purpose of our medical record. Records and images were personally reviewed where available.  HISTORY OF PRESENT ILLNESS: This is a 49 year old right-handed man with a history of bipolar disorder and panic attacks, presenting for evaluation of dizziness. He reports that the dizziness has improved now that he has been taken out of work. He reports he is here today for the tremors. He has a history of panic attacks where he would have to stop driving because he couldn't focus, "beyond drunk," and become shaky. This started several years ago while he was in a high stress situation at work. They put him in a different area at work and symptoms decreased, then he was asked to go back to his prior floor and symptoms recurred where he couldn't function for the past 7-8 months. These would last for several hours. He was started on Xanax by his psychiatrist last December. He reports that since he has been taken out of work, he has not had a problem with the dizziness since then. He reports the tremors started at least 7 years ago, he has been taking Lamictal and Lithium for at least 7 years, and has been on Propranolol for the tremors for a similar amount of time. There is no family history of tremors.   He denies any headaches, diplopia, dysarthria, dysphagia, neck/back pain, focal numbness/tingling/weakness. He has occasional blurred vision. He has a history of IBS.   PAST MEDICAL HISTORY: Past Medical History  Diagnosis Date  . BIPOLAR DISORDER UNSPECIFIED 11/29/2009  . 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  MEDICATIONS: Current Outpatient Prescriptions on File Prior to Visit  Medication Sig Dispense Refill  . buPROPion (WELLBUTRIN SR) 100 MG 12 hr tablet Take by mouth. Takes 2 tablets daily    . lamoTRIgine (LAMICTAL) 200 MG tablet Take 200 mg by mouth 2 (two) times daily.    Marland Kitchen lithium carbonate (ESKALITH) 450 MG CR tablet Take 450 mg by mouth. One tab QAM and One tab QHS    Xanax 0.5mg  BID prn Propranolol ER  qhs No current facility-administered medications on file prior to visit.    ALLERGIES: Allergies  Allergen Reactions  . Other     Other reaction(s): Other Allergic to STEROIDS, Reaction:Mania  . Bactrim [Sulfamethoxazole-Trimethoprim]     diarrhea  . Barbiturates   . Penicillins     FAMILY HISTORY: Family History  Problem Relation Age of Onset  . Hypertension Mother   . Bipolar disorder Father     SOCIAL HISTORY: History   Social History  . Marital Status: Married    Spouse Name: N/A    Number of Children: N/A  . Years of Education: N/A   Occupational History  . Not on file.   Social History Main Topics  . Smoking status: Former Games developer  . Smokeless tobacco: Former Neurosurgeon    Quit date: 05/05/2000  . Alcohol Use: No  . Drug Use: No  . Sexual Activity: Not on file  Other Topics Concern  . Not on file   Social History Narrative    REVIEW OF SYSTEMS: Constitutional: No fevers, chills, or sweats, no generalized fatigue, change in appetite Eyes: No visual changes, double vision, eye pain Ear, nose and throat: No hearing loss, ear pain, nasal congestion, sore throat Cardiovascular: No chest pain, palpitations Respiratory:  No shortness of breath at rest or with exertion, wheezes GastrointestinaI: No nausea, vomiting, diarrhea, abdominal pain, fecal incontinence Genitourinary:  No dysuria, urinary retention or frequency Musculoskeletal:  No  neck pain, back pain Integumentary: No rash, pruritus, skin lesions Neurological: as above Psychiatric: No depression, insomnia, anxiety Endocrine: No palpitations, fatigue, diaphoresis, mood swings, change in appetite, change in weight, increased thirst Hematologic/Lymphatic:  No anemia, purpura, petechiae. Allergic/Immunologic: no itchy/runny eyes, nasal congestion, recent allergic reactions, rashes  PHYSICAL EXAM: Filed Vitals:   02/05/14 1356  BP: 102/70  Pulse: 59  Resp: 16   General: No acute distress, tangential speech, needs redirection several times Head:  Normocephalic/atraumatic Eyes: Fundoscopic exam shows bilateral sharp discs, no vessel changes, exudates, or hemorrhages Neck: supple, no paraspinal tenderness, full range of motion Back: No paraspinal tenderness Heart: regular rate and rhythm Lungs: Clear to auscultation bilaterally. Vascular: No carotid bruits. Skin/Extremities: No rash, no edema Neurological Exam: Mental status: alert and oriented to person, place, and time, no dysarthria or aphasia, Fund of knowledge is appropriate.  Recent and remote memory are intact.  Attention and concentration are normal.    Able to name objects and repeat phrases. Cranial nerves: CN I: not tested CN II: pupils equal, round and reactive to light, visual fields intact, fundi unremarkable. CN III, IV, VI:  full range of motion, no nystagmus, no ptosis CN V: facial sensation intact CN VII: upper and lower face symmetric CN VIII: hearing intact to finger rub CN IX, X: gag intact, uvula midline CN XI: sternocleidomastoid and trapezius muscles intact CN XII: tongue midline Bulk & Tone: normal, no cogwheeling,no fasciculations. Motor: 5/5 throughout with no pronator drift. Sensation: intact to light touch, cold, pin, vibration and joint position sense.  No extinction to double simultaneous stimulation.  Romberg test negative Deep Tendon Reflexes: +2 throughout, no ankle  clonus Plantar responses: downgoing bilaterally Cerebellar: no incoordination on finger to nose, heel to shin. No dysdiadochokinesia Gait: narrow-based and steady, able to tandem walk adequately. Tremor: no tremors noted in the office today  IMPRESSION: This is a 49 year old right-handed man with a history of bipolar disorder, panic attacks, referred for dizziness. He reports today that the dizziness has resolved since he has been taken out of work. He reports symptoms started when he was moved to a more stressful floor on the job. He also has tremors, none noted in the office today, likely due to side effects of Lithium. He takes Propranolol for symptomatic treatment. The patient was reassured about his normal neurological exam. No further testing indicated at this time. He knows to call our office for any change in symptoms, he will follow-up on a prn basis.   Thank you for allowing me to participate in the care of this patient. Please do not hesitate to call for any questions or concerns.   Patrcia DollyKaren Vimal Derego, M.D.  CC: [facility] [phone] Misty StanleyLisa

## 2014-02-05 NOTE — Telephone Encounter (Signed)
Pt is able to come to appt now today and i will cancel appt for 03-22-14

## 2014-02-05 NOTE — Patient Instructions (Signed)
1. Your neurological exam is normal. Continue follow-up with your psychiatrist to discuss medications and side effects 2. Follow-up on an as needed basis

## 2014-02-13 ENCOUNTER — Encounter: Payer: Self-pay | Admitting: Neurology

## 2014-03-22 ENCOUNTER — Ambulatory Visit: Payer: Self-pay | Admitting: Neurology

## 2014-04-27 ENCOUNTER — Encounter: Payer: BLUE CROSS/BLUE SHIELD | Admitting: Family Medicine

## 2015-01-30 ENCOUNTER — Encounter (HOSPITAL_COMMUNITY): Payer: Self-pay

## 2015-01-30 ENCOUNTER — Observation Stay (HOSPITAL_COMMUNITY)
Admission: EM | Admit: 2015-01-30 | Discharge: 2015-02-02 | Disposition: A | Discharge status: Home / Self Care | Payer: BLUE CROSS/BLUE SHIELD | Attending: Internal Medicine | Admitting: Internal Medicine

## 2015-01-30 DIAGNOSIS — W19XXXA Unspecified fall, initial encounter: Secondary | ICD-10-CM | POA: Insufficient documentation

## 2015-01-30 DIAGNOSIS — T56891A Toxic effect of other metals, accidental (unintentional), initial encounter: Secondary | ICD-10-CM | POA: Diagnosis present

## 2015-01-30 DIAGNOSIS — E785 Hyperlipidemia, unspecified: Secondary | ICD-10-CM | POA: Insufficient documentation

## 2015-01-30 DIAGNOSIS — T43591A Poisoning by other antipsychotics and neuroleptics, accidental (unintentional), initial encounter: Secondary | ICD-10-CM | POA: Diagnosis not present

## 2015-01-30 DIAGNOSIS — Z880 Allergy status to penicillin: Secondary | ICD-10-CM | POA: Diagnosis not present

## 2015-01-30 DIAGNOSIS — R251 Tremor, unspecified: Secondary | ICD-10-CM | POA: Diagnosis present

## 2015-01-30 DIAGNOSIS — F3160 Bipolar disorder, current episode mixed, unspecified: Secondary | ICD-10-CM | POA: Diagnosis not present

## 2015-01-30 DIAGNOSIS — K219 Gastro-esophageal reflux disease without esophagitis: Secondary | ICD-10-CM | POA: Diagnosis not present

## 2015-01-30 DIAGNOSIS — E663 Overweight: Secondary | ICD-10-CM | POA: Diagnosis not present

## 2015-01-30 DIAGNOSIS — F319 Bipolar disorder, unspecified: Secondary | ICD-10-CM | POA: Diagnosis not present

## 2015-01-30 DIAGNOSIS — Z87891 Personal history of nicotine dependence: Secondary | ICD-10-CM | POA: Diagnosis not present

## 2015-01-30 DIAGNOSIS — R531 Weakness: Secondary | ICD-10-CM | POA: Diagnosis not present

## 2015-01-30 HISTORY — DX: Gastro-esophageal reflux disease without esophagitis: K21.9

## 2015-01-30 LAB — COMPREHENSIVE METABOLIC PANEL
ALK PHOS: 77 U/L (ref 38–126)
ALT: 27 U/L (ref 17–63)
AST: 19 U/L (ref 15–41)
Albumin: 4.2 g/dL (ref 3.5–5.0)
Anion gap: 8 (ref 5–15)
BUN: 14 mg/dL (ref 6–20)
CALCIUM: 10.2 mg/dL (ref 8.9–10.3)
CO2: 26 mmol/L (ref 22–32)
CREATININE: 1.1 mg/dL (ref 0.61–1.24)
Chloride: 107 mmol/L (ref 101–111)
Glucose, Bld: 95 mg/dL (ref 65–99)
Potassium: 4 mmol/L (ref 3.5–5.1)
Sodium: 141 mmol/L (ref 135–145)
TOTAL PROTEIN: 6.3 g/dL — AB (ref 6.5–8.1)
Total Bilirubin: 0.7 mg/dL (ref 0.3–1.2)

## 2015-01-30 LAB — CBC WITH DIFFERENTIAL/PLATELET
Basophils Absolute: 0 10*3/uL (ref 0.0–0.1)
Basophils Relative: 0 %
EOS PCT: 2 %
Eosinophils Absolute: 0.2 10*3/uL (ref 0.0–0.7)
HCT: 49.1 % (ref 39.0–52.0)
HEMOGLOBIN: 17 g/dL (ref 13.0–17.0)
LYMPHS ABS: 1.7 10*3/uL (ref 0.7–4.0)
LYMPHS PCT: 18 %
MCH: 31.3 pg (ref 26.0–34.0)
MCHC: 34.6 g/dL (ref 30.0–36.0)
MCV: 90.4 fL (ref 78.0–100.0)
MONOS PCT: 7 %
Monocytes Absolute: 0.6 10*3/uL (ref 0.1–1.0)
Neutro Abs: 6.6 10*3/uL (ref 1.7–7.7)
Neutrophils Relative %: 73 %
PLATELETS: 218 10*3/uL (ref 150–400)
RBC: 5.43 MIL/uL (ref 4.22–5.81)
RDW: 11.9 % (ref 11.5–15.5)
WBC: 9.1 10*3/uL (ref 4.0–10.5)

## 2015-01-30 LAB — ACETAMINOPHEN LEVEL

## 2015-01-30 LAB — LITHIUM LEVEL: Lithium Lvl: 1.72 mmol/L (ref 0.60–1.20)

## 2015-01-30 LAB — ETHANOL

## 2015-01-30 LAB — SALICYLATE LEVEL: Salicylate Lvl: <4 mg/dL (ref 2.8–30.0)

## 2015-01-30 MED ORDER — SODIUM CHLORIDE 0.9 % IV SOLN
INTRAVENOUS | Status: DC
  Administered 2015-01-30: 23:00:00 via INTRAVENOUS

## 2015-01-30 MED ORDER — BUPROPION HCL ER (SR) 150 MG PO TB12
300.0000 mg | ORAL_TABLET | Freq: Every day | ORAL | Status: DC
  Administered 2015-01-31 – 2015-02-02 (×3): 300 mg via ORAL
  Filled 2015-01-30 (×3): qty 2

## 2015-01-30 MED ORDER — BREXPIPRAZOLE 3 MG PO TABS
3.0000 mg | ORAL_TABLET | Freq: Every day | ORAL | Status: DC
  Administered 2015-01-30 – 2015-02-01 (×3): 3 mg via ORAL
  Filled 2015-01-30 (×5): qty 1

## 2015-01-30 MED ORDER — LAMOTRIGINE 200 MG PO TABS
200.0000 mg | ORAL_TABLET | Freq: Two times a day (BID) | ORAL | Status: DC
  Administered 2015-01-30 – 2015-02-02 (×6): 200 mg via ORAL
  Filled 2015-01-30 (×8): qty 1

## 2015-01-30 MED ORDER — PROPRANOLOL HCL ER 60 MG PO CP24
60.0000 mg | ORAL_CAPSULE | Freq: Every day | ORAL | Status: DC
  Administered 2015-01-30 – 2015-02-01 (×3): 60 mg via ORAL
  Filled 2015-01-30 (×4): qty 1

## 2015-01-30 MED ORDER — SODIUM CHLORIDE 0.9 % IV SOLN
INTRAVENOUS | Status: AC
  Administered 2015-01-31: 05:00:00 via INTRAVENOUS

## 2015-01-30 MED ORDER — SODIUM CHLORIDE 0.9 % IV BOLUS (SEPSIS)
1000.0000 mL | Freq: Once | INTRAVENOUS | Status: AC
  Administered 2015-01-30: 1000 mL via INTRAVENOUS

## 2015-01-30 NOTE — Progress Notes (Signed)
IV fluids d/c at 10:00PM tonight. Paged K. Schorr to clarify IV fluid orders. After speaking to her, Will start NS at 25500mL/hour per K.Schorr for at least next 30 minutes until new IV orders are placed.

## 2015-01-30 NOTE — ED Notes (Signed)
Onset 1 week pt has been very unsteady on feet, tremors, falling, blurred vision, worse today.  Onset 2 weeks ago psychiatrist increased Lithium from 450 mg BID to 900 mg BID.

## 2015-01-30 NOTE — ED Notes (Signed)
Report attempted - 30 minutes

## 2015-01-30 NOTE — ED Notes (Signed)
Roger Gonzalez [phone]

## 2015-01-30 NOTE — ED Notes (Signed)
Lab called with Lithium 1.72.   Called Dr. Anitra LauthPlunkett with results.

## 2015-01-30 NOTE — ED Notes (Signed)
Report attempted 

## 2015-01-30 NOTE — H&P (Signed)
History and Physical  Patient Name: Roger Gonzalez     ZOX:[id number]    DOB: 01/22/1966    DOA: 01/30/2015 Referring physician: Gwyneth Sprout, MD PCP: Laren Boom, DO      Chief Complaint: Fall and weakness  HPI: Roger Gonzalez is a 50 y.o. male with a past medical history significant for Bipolar I on lithium and chronic tremor who presents with fall and weakness.   About 2 weeks ago, the patient's psychiatrist doubled his dose of lithium.  About a week ago, he started to notice increased coarse tremor, blurry vision intermittently, and global weakness that progressed until today he fell and couldn't get up alone.  At no point was he unresponsive, syncopal, or with a seizure.    In the ED, he had normal renal function and was hemodynamically stable.  The lithium level was 1.72 mmol/L and the ECG was normal.  He denied other ingestions, intentional ingestion of extra lithium.  No recent diarrhea or vomiting or other illness.  He did have dental work this week, but his oral intake has been fine and his pain was controlled.  The patient is pretty sure the increase in his medicine was not in response to a low lithium level but just because he told his Dr at [facility] that he takes 2 tabs BID, and they prescribed a different strength tablet BID.    Review of Systems:  Pt complains of tremor, vision changes, fall, unsteadiness, leg weakness, stiffness. All other systems negative except as just noted or noted in the history of present illness.  Allergies  Allergen Reactions  . Other     Other reaction(s): Other Allergic to STEROIDS, Reaction:Mania  . Bactrim [Sulfamethoxazole-Trimethoprim]     diarrhea  . Barbiturates   . Penicillins     Prior to Admission medications   Medication Sig Start Date End Date Taking? Authorizing Provider  ALPRAZolam Prudy Feeler) 0.5 MG tablet 0.5 mg. 1 tablet twice daily as needed 01/05/14   Historical Provider, MD  buPROPion  (WELLBUTRIN SR) 100 MG 12 hr tablet Take by mouth. Takes 2 tablets daily    Historical Provider, MD  lamoTRIgine (LAMICTAL) 200 MG tablet Take 200 mg by mouth 2 (two) times daily.    Historical Provider, MD  lithium carbonate (ESKALITH) 450 MG CR tablet Take 450 mg by mouth. One tab QAM and One tab QHS    Historical Provider, MD  propranolol ER (INDERAL LA) 60 MG 24 hr capsule Take 60 mg by mouth at bedtime.    Historical Provider, MD  REXULTI 3 MG TABS Take 3 mg by mouth at bedtime. 1 tablet qhs 01/06/14   Historical Provider, MD    Past Medical History  Diagnosis Date  . BIPOLAR DISORDER UNSPECIFIED 11/29/2009  . UNSPECIFIED HYPOGAMMAGLOBULINEMIA 05/30/2009    Qualifier: Diagnosis of  By: Thomos Lemons      History reviewed. No pertinent past surgical history.  Family history: family history includes Bipolar disorder in his father; Hypertension in his mother.  Social History: Patient lives with his sister and brother in law for the last 4 months.  He is currently working.  He has a remote smoking history.  He is ambulatory without assistance at baseline.       Physical Exam: BP 123/83 mmHg  Pulse 65  Temp(Src) 98 F (36.7 C) (Oral)  Resp 20  Ht 5\' 9"  (1.753 m)  Wt 87.091 kg (192 lb)  BMI 28.34 kg/m2  SpO2  98% General appearance: Well-developed, adult male, alert and in no acute distress.  Somewhat slowed psychomotor.   Eyes: Anicteric, conjunctiva pink, lids and lashes normal.     ENT: No nasal deformity, discharge, or epistaxis.  OP moist without lesions.   Skin: Warm and dry.  Soemwhat pale. Cardiac: RRR, nl S1-S2, no murmurs appreciated.  Capillary refill is brisk.  No LE edema.  Radial pulses 2+ and symmetric. Respiratory: Normal respiratory rate and rhythm.  CTAB without rales or wheezes. Abdomen: Abdomen soft without rigidity.  No TTP. No ascites, distension.   MSK: No deformities or effusions. Neuro: Sensorium intact and responding to questions, attention normal.   Speech is fluent.  Psychomotor slowing, mild.  Moves all extremities equally but slowly.  Generally increased muscle tone.  Coarse tremor with arms outstretched and in legs.  No clonus appreciated, but ankle tone increased.  Patellar reflexes 2+.  CN normal.    Psych: Behavior appropriate.  Affect flat.  No evidence of aural or visual hallucinations or delusions.       Labs on Admission:  The metabolic panel shows normal electrolytes and renal function. Transaminases and bilirubin normal. The complete blood count shows no anemia, thrombocytopenia or leukocytosis. Salicylates, acetaminiophen, and alcohol are all normal. Lithium level 1.7 mmol/L only.   EKG: Independently reviewed. Sinus, rate 62.  No ST changes.  T waves normal.  QTc 417.  QRSd normal.    Assessment/Plan 1. Lithium toxicity:  This is new. Subacute after dose doubling two weeks ago.  The case was discussed with poison control who recommended overnight observation and fluid hydration.  ECG normal and arrhythmia rare, even with more severe lithium toxicity, will observe in medical surgical bed. -NS at 200 cc/hr -Repeat lithium level tomorrow -Hold lithium for now, anticipate to restarte before discharge with outpatient followup    2. Bipolar:  -Continue home lamotrigine and brexpiprazole and bupropion  3. Tremor:  The coarse tremor I observe is from lithium.   -Continue home propranolol    DVT PPx: Early ambulation Diet: Regular Consultants: None Code Status: Full Family Communication: Sister and brother in law present at bedside  Medical decision making: What exists of the patient's previous chart was reviewed in depth and the case was discussed with Dr. Anitra LauthPlunkett. Patient seen 8:56 PM on 01/30/2015.  Disposition Plan:  Observe overnight. Aggressive fluids and repeat Li level tomorrow.  If symptoms improving and level dropping, anticipate discharge to home.     Alberteen SamChristopher P Danford [facility] Pager [phone]

## 2015-01-30 NOTE — ED Notes (Signed)
Pt refusing IV at this time.   Dr. Anitra LauthPlunkett contacting poison control

## 2015-01-30 NOTE — ED Provider Notes (Addendum)
CSN: [id number]     Arrival date & time 01/30/15  1600 History   First MD Initiated Contact with Patient 01/30/15 1829     Chief Complaint  Patient presents with  . Gait Problem  . Tremors     (Consider location/radiation/quality/duration/timing/severity/associated sxs/prior Treatment) HPI Comments: Pt is a 50 y/o male with a history of bipolar disease presenting today with one week of worsening tremor and generalized weakness and today he was unable to walk. Patient states that approximately 2 weeks ago after seeing his doctor and getting his lithium refilled the bottle said to take 900 mg morning and night when he was taking 450 prior to that. For the last 2 weeks he's been taking 900 mg twice a day. The symptoms of tremor, jerking, generalized weakness and difficulty walking started approximately 1 week ago but has worsened. He denies any confusion. No chest pain or shortness of breath. He denies any other medication changes. He denies any suicidal or homicidal ideations. His sister is present with him and states that he is taking his medication the way they are prescribed. She is concerned that there may have been an error in prescribing.  The history is provided by the patient and a relative.    Past Medical History  Diagnosis Date  . BIPOLAR DISORDER UNSPECIFIED 11/29/2009  . UNSPECIFIED HYPOGAMMAGLOBULINEMIA 05/30/2009    Qualifier: Diagnosis of  By: Thomos Lemons     History reviewed. No pertinent past surgical history. Family History  Problem Relation Age of Onset  . Hypertension Mother   . Bipolar disorder Father    Social History  Substance Use Topics  . Smoking status: Former Games developer  . Smokeless tobacco: Former Neurosurgeon    Quit date: 05/05/2000  . Alcohol Use: No    Review of Systems  All other systems reviewed and are negative.     Allergies  Other; Bactrim; Barbiturates; and Penicillins  Home Medications   Prior to Admission medications   Medication Sig Start  Date End Date Taking? Authorizing Provider  ALPRAZolam Prudy Feeler) 0.5 MG tablet 0.5 mg. 1 tablet twice daily as needed 01/05/14   Historical Provider, MD  buPROPion (WELLBUTRIN SR) 100 MG 12 hr tablet Take by mouth. Takes 2 tablets daily    Historical Provider, MD  lamoTRIgine (LAMICTAL) 200 MG tablet Take 200 mg by mouth 2 (two) times daily.    Historical Provider, MD  lithium carbonate (ESKALITH) 450 MG CR tablet Take 450 mg by mouth. One tab QAM and One tab QHS    Historical Provider, MD  propranolol ER (INDERAL LA) 60 MG 24 hr capsule Take 60 mg by mouth at bedtime.    Historical Provider, MD  REXULTI 3 MG TABS Take 3 mg by mouth at bedtime. 1 tablet qhs 01/06/14   Historical Provider, MD   BP 113/84 mmHg  Pulse 61  Temp(Src) 98 F (36.7 C) (Oral)  Resp 16  Ht 5\' 9"  (1.753 m)  Wt 192 lb (87.091 kg)  BMI 28.34 kg/m2 Physical Exam  Constitutional: He is oriented to person, place, and time. He appears well-developed and well-nourished. No distress.  HENT:  Head: Normocephalic and atraumatic.  Mouth/Throat: Oropharynx is clear and moist.  Eyes: Conjunctivae and EOM are normal. Pupils are equal, round, and reactive to light.  Neck: Normal range of motion. Neck supple.  Cardiovascular: Normal rate, regular rhythm and intact distal pulses.   No murmur heard. Pulmonary/Chest: Effort normal and breath sounds normal. No respiratory distress. He has  no wheezes. He has no rales.  Abdominal: Soft. He exhibits no distension. There is no tenderness. There is no rebound and no guarding.  Musculoskeletal: Normal range of motion. He exhibits no edema or tenderness.  Neurological: He is alert and oriented to person, place, and time.  Tremor is present in the upper and lower extremities bilaterally with activity. 4 out of 5 strength in all 4 extremities however the right lower extremity seems to be slightly weaker. Hyperreflexia present. Normal sensation throughout. No clonus noted. Babinski negative.   Patient is unable to stand on his own from a seated position but when helped up has mild ataxia.  Skin: Skin is warm and dry. No rash noted. No erythema.  Psychiatric: He has a normal mood and affect. His behavior is normal.  Nursing note and vitals reviewed.   ED Course  Procedures (including critical care time) Labs Review Labs Reviewed  COMPREHENSIVE METABOLIC PANEL - Abnormal; Notable for the following:    Total Protein 6.3 (*)    All other components within normal limits  ACETAMINOPHEN LEVEL - Abnormal; Notable for the following:    Acetaminophen (Tylenol), Serum <10 (*)    All other components within normal limits  LITHIUM LEVEL - Abnormal; Notable for the following:    Lithium Lvl 1.72 (*)    All other components within normal limits  CBC WITH DIFFERENTIAL/PLATELET  ETHANOL  SALICYLATE LEVEL  URINE RAPID DRUG SCREEN, HOSP PERFORMED    Imaging Review No results found. I have personally reviewed and evaluated these images and lab results as part of my medical decision-making.   Date: 01/30/2015  Rate: 62  Rhythm: normal sinus rhythm  QRS Axis: normal  Intervals: normal  ST/T Wave abnormalities: normal  Conduction Disutrbances: none  Narrative Interpretation: unremarkable      MDM   Final diagnoses:  Lithium toxicity, accidental or unintentional, initial encounter    Patient is a 50 year old male who presents today with complaints of tremor, blurry vision, difficulty walking and ataxia, tremor present on exam. Concern for lithium toxicity as he has increased his dose in the last 2 weeks to 900 mg twice a day when he was taking 450 mg twice a day. Patient is not sure why the dose was increased but sister confirms that that is what the bottle said to take. Patient's labs today show a toxic lithium level of 1.72 with otherwise normal labs. CBC, CMP, alcohol, acetaminophen and salicylate levels are all within normal limits. Doubt but this was done as a suicidal gesture  as patient is not suicidal and seems to be appropriate.  EKG is pending. Discussed with poison control and they are evaluating whether he will need dialysis versus IV fluid only.  7:44 PM Patient's EKG is within normal limits. Spoke with poison control at this point recommend IV hydration after a liter bolus 200 mL rate and if no improvement symptoms in 6-8 hours then dialysis should be considered.   Gwyneth SproutWhitney Eudell Mcphee, MD 01/30/15 1944  Gwyneth SproutWhitney Lashawndra Lampkins, MD 01/30/15 1945

## 2015-01-31 ENCOUNTER — Encounter (HOSPITAL_COMMUNITY): Payer: Self-pay | Admitting: General Practice

## 2015-01-31 DIAGNOSIS — T56891A Toxic effect of other metals, accidental (unintentional), initial encounter: Secondary | ICD-10-CM | POA: Diagnosis not present

## 2015-01-31 DIAGNOSIS — F3160 Bipolar disorder, current episode mixed, unspecified: Secondary | ICD-10-CM | POA: Diagnosis not present

## 2015-01-31 DIAGNOSIS — R251 Tremor, unspecified: Secondary | ICD-10-CM | POA: Diagnosis not present

## 2015-01-31 LAB — LITHIUM LEVEL: LITHIUM LVL: 0.96 mmol/L (ref 0.60–1.20)

## 2015-01-31 LAB — CBC
HCT: 41.9 % (ref 39.0–52.0)
HEMOGLOBIN: 13.9 g/dL (ref 13.0–17.0)
MCH: 30.5 pg (ref 26.0–34.0)
MCHC: 33.2 g/dL (ref 30.0–36.0)
MCV: 91.9 fL (ref 78.0–100.0)
Platelets: 154 10*3/uL (ref 150–400)
RBC: 4.56 MIL/uL (ref 4.22–5.81)
RDW: 11.9 % (ref 11.5–15.5)
WBC: 7.5 10*3/uL (ref 4.0–10.5)

## 2015-01-31 LAB — COMPREHENSIVE METABOLIC PANEL
ALBUMIN: 3.2 g/dL — AB (ref 3.5–5.0)
ALK PHOS: 61 U/L (ref 38–126)
ALT: 28 U/L (ref 17–63)
ANION GAP: 5 (ref 5–15)
AST: 19 U/L (ref 15–41)
BUN: 14 mg/dL (ref 6–20)
CALCIUM: 9.1 mg/dL (ref 8.9–10.3)
CO2: 26 mmol/L (ref 22–32)
Chloride: 111 mmol/L (ref 101–111)
Creatinine, Ser: 1.12 mg/dL (ref 0.61–1.24)
GFR calc Af Amer: >60 mL/min (ref >60)
GFR calc non Af Amer: >60 mL/min (ref >60)
GLUCOSE: 109 mg/dL — AB (ref 65–99)
Potassium: 4 mmol/L (ref 3.5–5.1)
SODIUM: 142 mmol/L (ref 135–145)
Total Bilirubin: 0.4 mg/dL (ref 0.3–1.2)
Total Protein: 4.9 g/dL — ABNORMAL LOW (ref 6.5–8.1)

## 2015-01-31 MED ORDER — SODIUM CHLORIDE 0.9 % IV SOLN
INTRAVENOUS | Status: DC
  Administered 2015-02-01 – 2015-02-02 (×2): via INTRAVENOUS

## 2015-01-31 MED ORDER — HYDROCODONE-ACETAMINOPHEN 5-325 MG PO TABS
2.0000 | ORAL_TABLET | Freq: Once | ORAL | Status: AC
  Administered 2015-01-31: 2 via ORAL
  Filled 2015-01-31: qty 2

## 2015-01-31 MED ORDER — SODIUM CHLORIDE 0.9 % IV SOLN
INTRAVENOUS | Status: DC
  Administered 2015-01-31 (×2): via INTRAVENOUS

## 2015-01-31 MED ORDER — HYDROCODONE-ACETAMINOPHEN 5-325 MG PO TABS: 1.0000 | ORAL_TABLET | ORAL | Status: DC | PRN

## 2015-01-31 MED ORDER — SODIUM CHLORIDE 0.9 % IV SOLN: INTRAVENOUS | Status: AC

## 2015-01-31 NOTE — Progress Notes (Signed)
[facility] PROGRESS NOTE  Roger Gonzalez WJX:[id number] DOB: 01/07/1966 DOA: 01/30/2015 PCP: Laren Boom, DO    Brief Narrative: Roger Gonzalez is a 50 year old male with Bipolar Type I, previously taking 450mg  Lithium BID.  Outpatient dose increased to 900 mg BID about two weeks ago. Last week patient noticed increased tremors and intermittent blurry vision. Presented to ED yesterday after fall and weakness. In ED, lithium level was 1.72 mmol/L and ECG normal.  HPI/Subjective: Patient complains of increased leg tremors since yesterday, unable to stand independently today. Hand tremors have mostly resolved. Denies N/V/D and abdominal cramps.   Assessment/Plan:  Principal Problem:   Lithium toxicity Active Problems:   Bipolar 1 disorder, mixed (HCC)   Tremor   Lithium toxicity  Began taking 900mg  BID lithium 2 weeks ago, dose was increased from 450mg  BID.  Lithium level has decreased since admission.  -Continue to hold lithium. Anticipate to restart before discharge with psychiatry follow-up -Continue IV fluids 150 cc/hr -Encouraged fluid intake, no ARF check BMP in a.m.  Bipolar Type I Disorder Continue lamotrigine, brexpiprazole, and bupropion  Tremor/?EPS Patient has tremors involving his upper and lower extremity, couldn't stand on his own. Can be secondary to lithium toxicity, Cannot rule out extrapyramidal symptoms, patient is on brexpiprazole Continue propanolol, as mentioned above unable to stand on his own. PT to evaluate, hopefully this will improve after resolution of the lithium toxicity.   Code Status: Full Family Communication: Father present at bedside Disposition Plan: Observe overnight for tremor improvement   Consultants: None  Procedures: None  Antibiotics: None    Objective: Filed Vitals:   01/30/15 2143 01/31/15 0523  BP: 135/69 111/69  Pulse: 65 60  Temp: 97.3 F (36.3 C) 97.9 F (36.6 C)  Resp: 19 19    Intake/Output  Summary (Last 24 hours) at 01/31/15 1123 Last data filed at 01/31/15 0949  Gross per 24 hour  Intake      0 ml  Output    350 ml  Net   -350 ml   Filed Weights   01/30/15 1709 01/30/15 2143  Weight: 87.091 kg (192 lb) 81.874 kg (180 lb 8 oz)    Exam:   General:  Awake and alert, in no acute distress  Cardiovascular: RRR, no LE edema  Respiratory: CTA b/l  Musculoskeletal: UE strength 5+ b/l, LE strength 3+ b/l, coarse tremor LE b/l  Psych: Behavior appropriate, flat affect   Data Reviewed: Basic Metabolic Panel:  Recent Labs Lab 01/30/15 1740 01/31/15 0450  NA 141 142  K 4.0 4.0  CL 107 111  CO2 26 26  GLUCOSE 95 109*  BUN 14 14  CREATININE 1.10 1.12  CALCIUM 10.2 9.1   Liver Function Tests:  Recent Labs Lab 01/30/15 1740 01/31/15 0450  AST 19 19  ALT 27 28  ALKPHOS 77 61  BILITOT 0.7 0.4  PROT 6.3* 4.9*  ALBUMIN 4.2 3.2*   No results for input(s): LIPASE, AMYLASE in the last 168 hours. No results for input(s): AMMONIA in the last 168 hours. CBC:  Recent Labs Lab 01/30/15 1740 01/31/15 0450  WBC 9.1 7.5  NEUTROABS 6.6  --   HGB 17.0 13.9  HCT 49.1 41.9  MCV 90.4 91.9  PLT 218 154   Cardiac Enzymes: No results for input(s): CKTOTAL, CKMB, CKMBINDEX, TROPONINI in the last 168 hours. BNP (last 3 results) No results for input(s): BNP in the last 8760 hours.  ProBNP (last 3 results) No results for input(s):  PROBNP in the last 8760 hours.  CBG: No results for input(s): GLUCAP in the last 168 hours.  No results found for this or any previous visit (from the past 240 hour(s)).   Studies: No results found.  Scheduled Meds: . sodium chloride   Intravenous STAT  . Brexpiprazole  3 mg Oral QHS  . buPROPion  300 mg Oral Daily  . lamoTRIgine  200 mg Oral BID  . propranolol ER  60 mg Oral QHS   Continuous Infusions: . sodium chloride 150 mL/hr at 01/31/15 0534    Principal Problem:   Lithium toxicity Active Problems:   Bipolar 1  disorder, mixed (HCC)   Tremor    Time spent: 40 minutes    Signed:  Clint LippsELMAHI,Rydell Wiegel A, MD  [facility] Pager [phone]. If 7PM-7AM, please contact night-coverage at [email or web address], password [email or web address] 01/31/2015, 11:23 AM  LOS: 1 day

## 2015-01-31 NOTE — Progress Notes (Signed)
Pt. States he is feeling fine and is not having any tremors.

## 2015-01-31 NOTE — Progress Notes (Signed)
Attempted to page K. Schorr a second time after not receiving IV fluid orders. Still no repsonse. Paged Claiborne Billingsallahan around 5:30 and received new IV fluid orders. I changed the IV rate from 27600mL/hr to 12950mL/hr.

## 2015-02-01 DIAGNOSIS — R251 Tremor, unspecified: Secondary | ICD-10-CM | POA: Diagnosis not present

## 2015-02-01 DIAGNOSIS — T56891A Toxic effect of other metals, accidental (unintentional), initial encounter: Secondary | ICD-10-CM | POA: Diagnosis not present

## 2015-02-01 DIAGNOSIS — F3160 Bipolar disorder, current episode mixed, unspecified: Secondary | ICD-10-CM | POA: Diagnosis not present

## 2015-02-01 LAB — BASIC METABOLIC PANEL
Anion gap: 7 (ref 5–15)
BUN: 8 mg/dL (ref 6–20)
CHLORIDE: 110 mmol/L (ref 101–111)
CO2: 26 mmol/L (ref 22–32)
CREATININE: 1.02 mg/dL (ref 0.61–1.24)
Calcium: 8.8 mg/dL — ABNORMAL LOW (ref 8.9–10.3)
Glucose, Bld: 103 mg/dL — ABNORMAL HIGH (ref 65–99)
POTASSIUM: 4.1 mmol/L (ref 3.5–5.1)
SODIUM: 143 mmol/L (ref 135–145)

## 2015-02-01 NOTE — Progress Notes (Signed)
[facility] PROGRESS NOTE  Roger Gonzalez [id number]:[id number] DOB: [birth year] DOA: 01/30/2015 PCP: Laren Boom, DO  HPI/Subjective: Patient states leg tremors have improved, was able to stand with assistance at bedside but still uncomfortable with taking any steps. Stated blurry vision resolved after admission to hospital.   Assessment/Plan: Principal Problem:  Lithium toxicity Active Problems:  Bipolar 1 disorder, mixed (HCC)  Tremor  Lithium toxicity  Patient began taking 900mg  BID lithium 2 weeks ago, dose was increased from 450mg  BID. Lithium level decreased to 0.96 mmol/L compared to admission level of 1.72 mmol/L.  -Continue to hold lithium. Consulted neurology and discussed possible discontinuation of Lithium at discharge, with increase of other antipsychotic medication -Continue IV fluids 100 cc/hr, good urine output greater than 1L in last 24 hours -Encouraged fluid intake, no ARF, Creatinine 1.02 mg/dL this a.m.  Bipolar Type I Disorder Continue lamotrigine, brexpiprazole, and bupropion  Tremor/?EPS Patient has tremors involving his upper and lower extremity, couldn't stand on his own yesterday, but was able to stand today at bedside with assistance Most likely secondary to lithium toxicity; Neurology discussed currently prescribed brexpiprazole, 2nd generation anti-pyschotic, less likely to cause extrapyramidal symptoms Continue propanolol, as mentioned above for tremor. PT to evaluate, hopefully this will improve after resolution of the lithium toxicity.    Code Status: Full code Family Communication: Plan discussed with patient, no family at bedside Disposition Plan: tele   Consultants: psych  Procedures: None  Antibiotics: None    Objective: Filed Vitals:   01/31/15 2105 02/01/15 0352  BP: 136/79 130/84  Pulse: 71 64  Temp: 97.7 F (36.5 C) 97.9 F (36.6 C)  Resp: 19 19    Intake/Output Summary (Last 24 hours) at 02/01/15 0928 Last  data filed at 02/01/15 0811  Gross per 24 hour  Intake   1740 ml  Output   2775 ml  Net  -1035 ml   Filed Weights   01/30/15 1709 01/30/15 2143  Weight: 87.091 kg (192 lb) 81.874 kg (180 lb 8 oz)    Exam:   General:  Awake and alert, oriented x 3, in no acute distress  Cardiovascular: regular rate and rhythm, no LE edema  Respiratory: clear to ausculation bilaterally  Musculoskeletal: upper extremity strength 5/5 bilaterally and slight tremor present when testing strength, lower extremity strength 4/5 bilaterally, coarse tremor lower extremities bilaterally, but improved since yesterday   Psych: behavior appropriate  Data Reviewed: Basic Metabolic Panel:  Recent Labs Lab 01/30/15 1740 01/31/15 0450 02/01/15 0331  NA 141 142 143  K 4.0 4.0 4.1  CL 107 111 110  CO2 26 26 26   GLUCOSE 95 109* 103*  BUN 14 14 8   CREATININE 1.10 1.12 1.02  CALCIUM 10.2 9.1 8.8*   Liver Function Tests:  Recent Labs Lab 01/30/15 1740 01/31/15 0450  AST 19 19  ALT 27 28  ALKPHOS 77 61  BILITOT 0.7 0.4  PROT 6.3* 4.9*  ALBUMIN 4.2 3.2*   No results for input(s): LIPASE, AMYLASE in the last 168 hours. No results for input(s): AMMONIA in the last 168 hours. CBC:  Recent Labs Lab 01/30/15 1740 01/31/15 0450  WBC 9.1 7.5  NEUTROABS 6.6  --   HGB 17.0 13.9  HCT 49.1 41.9  MCV 90.4 91.9  PLT 218 154   Cardiac Enzymes: No results for input(s): CKTOTAL, CKMB, CKMBINDEX, TROPONINI in the last 168 hours. BNP (last 3 results) No results for input(s): BNP in the last 8760 hours.  ProBNP (last 3 results)  No results for input(s): PROBNP in the last 8760 hours.  CBG: No results for input(s): GLUCAP in the last 168 hours.  No results found for this or any previous visit (from the past 240 hour(s)).   Studies: No results found.  Scheduled Meds: . Brexpiprazole  3 mg Oral QHS  . buPROPion  300 mg Oral Daily  . lamoTRIgine  200 mg Oral BID  . propranolol ER  60 mg Oral QHS    Continuous Infusions: . sodium chloride 100 mL/hr at 01/31/15 1845    Principal Problem:   Lithium toxicity Active Problems:   Bipolar 1 disorder, mixed (HCC)   Tremor    Time spent: 30 minutes    Signed: Clint LippsELMAHI,Jahziel Sinn A, MD   [facility] Pager [phone]. If 7PM-7AM, please contact night-coverage at [email or web address], password [email or web address] 02/01/2015, 9:28 AM  LOS: 2 days

## 2015-02-01 NOTE — Consult Note (Signed)
[facility] Consult   Reason for Consult:  Bipolar disorder and lithium toxicity Referring Physician:  Dr. Hartford Poli Patient Identification: [name] MRN:  [id number] Principal Diagnosis: Lithium toxicity Diagnosis:   Patient Active Problem List   Diagnosis Date Noted  . Lithium toxicity [T56.891A] 01/30/2015  . Tremor [R25.1] 01/30/2015  . Apnea [R06.81] 08/13/2013  . Bipolar 1 disorder, mixed ([place]) [F31.60] 05/07/2012  . Hyperlipidemia with target LDL less than 160 [E78.5] 05/05/2012  . Lithium use [Z79.899] 04/23/2012  . Polyuria [R35.8] 04/09/2012  . UNSPECIFIED HYPOGAMMAGLOBULINEMIA [D80.1] 05/30/2009    Total Time spent with patient: 1 hour  Subjective:   Roger Gonzalez is a 50 y.o. male patient admitted with lithium toxicity.  HPI:  Roger Gonzalez is a 51 y.o. male with a past medical history significant for Bipolar I on lithium and chronic tremor who presents with fall and weakness. Patient seen, chart reviewed for psychiatric consultation and evaluation of lithium toxicity. Patient reportedly overdosed accidentally his lithium 900 mg twice daily for 2 weeks instead of 450 mg twice daily because of communication problem and issue with the labeling on the container. Patient presented with lithium toxicity especially increased coarse tremors, blurred vision intermittently, global weakness and serum lithium level was elevated to 1.72 in the emergency department. Patient denied current symptoms of depression, anxiety, mania, psychosis and also denied suicidal/homicidal thoughts, intentions or plans. He denied other ingestions, intentional ingestion of extra lithium. No recent diarrhea or vomiting or other illness.   Past Psychiatric History: Patient has been receiving outpatient medication management from [facility] over 15 years and has not required inpatient psychiatric hospitalization.  Risk to Self: Is patient at risk for  suicide?: No Risk to Others:   Prior Inpatient Therapy:   Prior Outpatient Therapy:    Past Medical History:  Past Medical History  Diagnosis Date  . BIPOLAR DISORDER UNSPECIFIED 11/29/2009  . UNSPECIFIED HYPOGAMMAGLOBULINEMIA 05/30/2009    Qualifier: Diagnosis of  By: Esmeralda Arthur    . GERD (gastroesophageal reflux disease)     Past Surgical History  Procedure Laterality Date  . Tonsillectomy     Family History:  Family History  Problem Relation Age of Onset  . Hypertension Mother   . Bipolar disorder Father    Family Psychiatric  History: Unknown Social History:  History  Alcohol Use No     History  Drug Use No    Social History   Social History  . Marital Status: Married    Spouse Name: N/A  . Number of Children: N/A  . Years of Education: N/A   Social History Main Topics  . Smoking status: Former Research scientist (life sciences)  . Smokeless tobacco: Former Systems developer    Quit date: 05/05/2000  . Alcohol Use: No  . Drug Use: No  . Sexual Activity: Not Asked   Other Topics Concern  . None   Social History Narrative   Additional Social History:                          Allergies:   Allergies  Allergen Reactions  . Other Other (See Comments)    Steroids cause mania  . Bactrim [Sulfamethoxazole-Trimethoprim] Diarrhea    diarrhea  . Barbiturates Other (See Comments)    hyperactivity  . Penicillins Hives    Has patient had a PCN reaction causing immediate rash, facial/tongue/throat swelling, SOB or lightheadedness with hypotension: Yes Has patient had a PCN  reaction causing severe rash involving mucus membranes or skin necrosis: No Has patient had a PCN reaction that required hospitalization No Has patient had a PCN reaction occurring within the last 10 years: Yes If all of the above answers are "NO", then may proceed with Cephalosporin use.    Labs:  Results for orders placed or performed during the hospital encounter of 01/30/15 (from the past 48 hour(s))  CBC with  Differential/Platelet     Status: None   Collection Time: 01/30/15  5:40 PM  Result Value Ref Range   WBC 9.1 4.0 - 10.5 K/uL   RBC 5.43 4.22 - 5.81 MIL/uL   Hemoglobin 17.0 13.0 - 17.0 g/dL   HCT 49.1 39.0 - 52.0 %   MCV 90.4 78.0 - 100.0 fL   MCH 31.3 26.0 - 34.0 pg   MCHC 34.6 30.0 - 36.0 g/dL   RDW 11.9 11.5 - 15.5 %   Platelets 218 150 - 400 K/uL   Neutrophils Relative % 73 %   Neutro Abs 6.6 1.7 - 7.7 K/uL   Lymphocytes Relative 18 %   Lymphs Abs 1.7 0.7 - 4.0 K/uL   Monocytes Relative 7 %   Monocytes Absolute 0.6 0.1 - 1.0 K/uL   Eosinophils Relative 2 %   Eosinophils Absolute 0.2 0.0 - 0.7 K/uL   Basophils Relative 0 %   Basophils Absolute 0.0 0.0 - 0.1 K/uL  Comprehensive metabolic panel     Status: Abnormal   Collection Time: 01/30/15  5:40 PM  Result Value Ref Range   Sodium 141 135 - 145 mmol/L   Potassium 4.0 3.5 - 5.1 mmol/L   Chloride 107 101 - 111 mmol/L   CO2 26 22 - 32 mmol/L   Glucose, Bld 95 65 - 99 mg/dL   BUN 14 6 - 20 mg/dL   Creatinine, Ser 1.10 0.61 - 1.24 mg/dL   Calcium 10.2 8.9 - 10.3 mg/dL   Total Protein 6.3 (L) 6.5 - 8.1 g/dL   Albumin 4.2 3.5 - 5.0 g/dL   AST 19 15 - 41 U/L   ALT 27 17 - 63 U/L   Alkaline Phosphatase 77 38 - 126 U/L   Total Bilirubin 0.7 0.3 - 1.2 mg/dL   GFR calc non Af Amer >60 >60 mL/min   GFR calc Af Amer >60 >60 mL/min    Comment: (NOTE) The eGFR has been calculated using the CKD EPI equation. This calculation has not been validated in all clinical situations. eGFR's persistently <60 mL/min signify possible Chronic Kidney Disease.    Anion gap 8 5 - 15  Ethanol     Status: None   Collection Time: 01/30/15  5:41 PM  Result Value Ref Range   Alcohol, Ethyl (B) <5 <5 mg/dL    Comment:        LOWEST DETECTABLE LIMIT FOR SERUM ALCOHOL IS 5 mg/dL FOR MEDICAL PURPOSES ONLY   Acetaminophen level     Status: Abnormal   Collection Time: 01/30/15  5:41 PM  Result Value Ref Range   Acetaminophen (Tylenol), Serum <10  (L) 10 - 30 ug/mL    Comment:        THERAPEUTIC CONCENTRATIONS VARY SIGNIFICANTLY. A RANGE OF 10-30 ug/mL MAY BE AN EFFECTIVE CONCENTRATION FOR MANY PATIENTS. HOWEVER, SOME ARE BEST TREATED AT CONCENTRATIONS OUTSIDE THIS RANGE. ACETAMINOPHEN CONCENTRATIONS >150 ug/mL AT 4 HOURS AFTER INGESTION AND >50 ug/mL AT 12 HOURS AFTER INGESTION ARE OFTEN ASSOCIATED WITH TOXIC REACTIONS.   Salicylate level  Status: None   Collection Time: 01/30/15  5:41 PM  Result Value Ref Range   Salicylate Lvl <5.4 2.8 - 30.0 mg/dL  Lithium level     Status: Abnormal   Collection Time: 01/30/15  5:42 PM  Result Value Ref Range   Lithium Lvl 1.72 (HH) 0.60 - 1.20 mmol/L    Comment: CRITICAL RESULT CALLED TO, READ BACK BY AND VERIFIED WITH: C PRICE,RN 1824 01/30/15 D BRADLEY   Comprehensive metabolic panel     Status: Abnormal   Collection Time: 01/31/15  4:50 AM  Result Value Ref Range   Sodium 142 135 - 145 mmol/L   Potassium 4.0 3.5 - 5.1 mmol/L   Chloride 111 101 - 111 mmol/L   CO2 26 22 - 32 mmol/L   Glucose, Bld 109 (H) 65 - 99 mg/dL   BUN 14 6 - 20 mg/dL   Creatinine, Ser 1.12 0.61 - 1.24 mg/dL   Calcium 9.1 8.9 - 10.3 mg/dL   Total Protein 4.9 (L) 6.5 - 8.1 g/dL   Albumin 3.2 (L) 3.5 - 5.0 g/dL   AST 19 15 - 41 U/L   ALT 28 17 - 63 U/L   Alkaline Phosphatase 61 38 - 126 U/L   Total Bilirubin 0.4 0.3 - 1.2 mg/dL   GFR calc non Af Amer >60 >60 mL/min   GFR calc Af Amer >60 >60 mL/min    Comment: (NOTE) The eGFR has been calculated using the CKD EPI equation. This calculation has not been validated in all clinical situations. eGFR's persistently <60 mL/min signify possible Chronic Kidney Disease.    Anion gap 5 5 - 15  CBC     Status: None   Collection Time: 01/31/15  4:50 AM  Result Value Ref Range   WBC 7.5 4.0 - 10.5 K/uL   RBC 4.56 4.22 - 5.81 MIL/uL   Hemoglobin 13.9 13.0 - 17.0 g/dL   HCT 41.9 39.0 - 52.0 %   MCV 91.9 78.0 - 100.0 fL   MCH 30.5 26.0 - 34.0 pg   MCHC  33.2 30.0 - 36.0 g/dL   RDW 11.9 11.5 - 15.5 %   Platelets 154 150 - 400 K/uL  Lithium level     Status: None   Collection Time: 01/31/15  4:50 AM  Result Value Ref Range   Lithium Lvl 0.96 0.60 - 1.20 mmol/L  Basic metabolic panel     Status: Abnormal   Collection Time: 02/01/15  3:31 AM  Result Value Ref Range   Sodium 143 135 - 145 mmol/L   Potassium 4.1 3.5 - 5.1 mmol/L   Chloride 110 101 - 111 mmol/L   CO2 26 22 - 32 mmol/L   Glucose, Bld 103 (H) 65 - 99 mg/dL   BUN 8 6 - 20 mg/dL   Creatinine, Ser 1.02 0.61 - 1.24 mg/dL   Calcium 8.8 (L) 8.9 - 10.3 mg/dL   GFR calc non Af Amer >60 >60 mL/min   GFR calc Af Amer >60 >60 mL/min    Comment: (NOTE) The eGFR has been calculated using the CKD EPI equation. This calculation has not been validated in all clinical situations. eGFR's persistently <60 mL/min signify possible Chronic Kidney Disease.    Anion gap 7 5 - 15    Current Facility-Administered Medications  Medication Dose Route Frequency Provider Last Rate Last Dose  . 0.9 %  sodium chloride infusion   Intravenous Continuous Verlee Monte, MD 100 mL/hr at 01/31/15 1845    .  Brexpiprazole TABS 3 mg  3 mg Oral QHS Edwin Dada, MD   3 mg at 01/31/15 2153  . buPROPion (WELLBUTRIN SR) 12 hr tablet 300 mg  300 mg Oral Daily Edwin Dada, MD   300 mg at 02/01/15 1001  . HYDROcodone-acetaminophen (NORCO/VICODIN) 5-325 MG per tablet 1 tablet  1 tablet Oral Q4H PRN Rhetta Mura Schorr, NP      . lamoTRIgine (LAMICTAL) tablet 200 mg  200 mg Oral BID Edwin Dada, MD   200 mg at 02/01/15 1001  . propranolol ER (INDERAL LA) 24 hr capsule 60 mg  60 mg Oral QHS Edwin Dada, MD   60 mg at 01/31/15 2153    Musculoskeletal: Strength & Muscle Tone: within normal limits Gait & Station: normal Patient leans: N/A  Psychiatric Specialty Exam: ROS patient denied nausea, vomiting, diarrhea, abdominal pain, shortness of breath and chest pain. No  Fever-chills, No Headache, No changes with Vision or hearing, reports vertigo No problems swallowing food or Liquids, No Chest pain, Cough or Shortness of Breath, No Abdominal pain, No Nausea or Vommitting, Bowel movements are regular, No Blood in stool or Urine, No dysuria, No new skin rashes or bruises, No new joints pains-aches,  No new weakness, tingling, numbness in any extremity, No recent weight gain or loss, No polyuria, polydypsia or polyphagia,  A full 10 point Review of Systems was done, except as stated above, all other Review of Systems were negative.  Blood pressure 130/84, pulse 64, temperature 97.9 F (36.6 C), temperature source Oral, resp. rate 19, height 5' 9"  (4.970 m), weight 81.874 kg (180 lb 8 oz), SpO2 98 %.Body mass index is 26.64 kg/(m^2).  General Appearance: Casual  Eye Contact::  Good  Speech:  Clear and Coherent  Volume:  Normal  Mood:  Anxious  Affect:  Appropriate and Congruent  Thought Process:  Coherent and Goal Directed  Orientation:  Full (Time, Place, and Person)  Thought Content:  WDL  Suicidal Thoughts:  No  Homicidal Thoughts:  No  Memory:  Immediate;   Fair Recent;   Fair  Judgement:  Fair  Insight:  Good  Psychomotor Activity:  Normal  Concentration:  Fair  Recall:  Good  Fund of Knowledge:Good  Language: Good  Akathisia:  Negative  Handed:  Right  AIMS (if indicated):     Assets:  Communication Skills Desire for Improvement Financial Resources/Insurance Housing Intimacy Leisure Time Resilience Social Support Talents/Skills Transportation Vocational/Educational  ADL's:  Intact  Cognition: WNL  Sleep:      Treatment Plan Summary: Daily contact with patient to assess and evaluate symptoms and progress in treatment and Medication management  Patient has improved his lithium toxicity symptoms with the supportive care and discontinuation of lithium, patient has normal renal functions and hepatic functions. Patient was  educated about lithium therapy and need of monitoring serum lithium level and adverse affects Patient will be started on his Eskaltth 450 mg twice daily and follow-up with the serum lithium level in 5 days along with thyroid function test and also outpatient psychiatric services as scheduled Patient will continue all his home medication without any changes Case discussed with the Dr. [place] Pink Appreciate psychiatric consultation and we sign off at this time Please contact [phone] or [phone] if needs further assistance   Disposition: Patient does not meet criteria for psychiatric inpatient admission. Supportive therapy provided about ongoing stressors.  Lisle Skillman,JANARDHAHA R. 02/01/2015 1:19 PM

## 2015-02-01 NOTE — Evaluation (Addendum)
Physical Therapy Evaluation Patient Details Name: Roger RummageWallace G Gentile MRN: [id number] DOB: 1965/10/28 Today's Date: 02/01/2015   History of Present Illness  Roger Gonzalez is a 50 y.o. male with a past medical history significant for Bipolar I on lithium and chronic tremor who presents with fall and weakness.   Clinical Impression  Patient presents with decreased independence with mobility due to deficits noted in problem list below.  He will benefit from skilled PT in the acute setting to allow d/c home as well as from follow up outpatient PT at d/c to expedite return to work when stable.    Follow Up Recommendations Outpatient PT    Equipment Recommendations  None recommended by PT    Recommendations for Other Services       Precautions / Restrictions Precautions Precautions: Fall      Mobility  Bed Mobility Overal bed mobility: Modified Independent             General bed mobility comments: but slight tremor noted with activity and tendency to perform movements engross rather than with dissociation of movement  Transfers Overall transfer level: Modified independent               General transfer comment: sit <> stand from recliner  Ambulation/Gait Ambulation/Gait assistance: Supervision Ambulation Distance (Feet): 250 Feet (with one seated rest break) Assistive device: None Gait Pattern/deviations: Decreased stride length;Shuffle;Trunk flexed     General Gait Details: decreased trunk rotation, keeps arms flexed and anterior, stiff movements throughout  Stairs Stairs: Yes Stairs assistance: Min guard Stair Management: Alternating pattern;One rail Right;Forwards Number of Stairs: 3 (x 2 trials) General stair comments: pt noted feeling shaky and mild tremor noted with this task  Wheelchair Mobility    Modified Rankin (Stroke Patients Only)       Balance                                 Standardized Balance Assessment Standardized  Balance Assessment : Dynamic Gait Index   Dynamic Gait Index Level Surface: Mild Impairment Change in Gait Speed: Mild Impairment Gait with Horizontal Head Turns: Mild Impairment Gait with Vertical Head Turns: Mild Impairment Gait and Pivot Turn: Mild Impairment Step Over Obstacle: Mild Impairment Step Around Obstacles: Normal Steps: Mild Impairment Total Score: 17       Pertinent Vitals/Pain Pain Assessment: No/denies pain    Home Living Family/patient expects to be discharged to:: Private residence Living Arrangements: Other relatives (sister) Available Help at Discharge: Family Type of Home: House Home Access: Level entry     Home Layout: Multi-level Home Equipment: None      Prior Function Level of Independence: Independent         Comments: 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, lifting, driving fork lift, etc     Hand Dominance   Dominant Hand: Right    Extremity/Trunk Assessment   Upper Extremity Assessment: RUE deficits/detail;LUE deficits/detail RUE Deficits / Details: AROM and strength WFL, slightly slowed coordination with pron/sup, intact sensation, mild tremor with FNF     LUE Deficits / Details: AROM limited shoulder flexion and elbow extension due to fearful of pulling out IV in L antecubital; strength grossly WFL, slightly slowed coordination with pron/sup, intact sensation, mild tremor with FNF   Lower Extremity Assessment: RLE deficits/detail;LLE deficits/detail RLE Deficits / Details: AROM and strength WFL, intact sensation to light touch and intact coordination with toe tapping  LLE Deficits / Details: AROM and strength WFL, intact sensation to light touch and intact coordination with toe tapping     Communication   Communication: No difficulties  Cognition Arousal/Alertness: Awake/alert Behavior During Therapy: WFL for tasks assessed/performed Overall Cognitive Status: Within Functional Limits for tasks assessed                       General Comments      Exercises        Assessment/Plan    PT Assessment Patient needs continued PT services  PT Diagnosis Abnormality of gait   PT Problem List Decreased strength;Decreased coordination;Decreased balance;Decreased mobility;Decreased activity tolerance  PT Treatment Interventions Balance training;Gait training;Stair training;Functional mobility training;Patient/family education;Therapeutic activities;Therapeutic exercise   PT Goals (Current goals can be found in the Care Plan section) Acute Rehab PT Goals Patient Stated Goal: To return home and back to work PT Goal Formulation: With patient Time For Goal Achievement: 02/08/15 Potential to Achieve Goals: Good Additional Goals Additional Goal #1: Patient will demonstrate decreased fall risk with DGI at or above 20/24.    Frequency Min 3X/week   Barriers to discharge        Co-evaluation               End of Session Equipment Utilized During Treatment: Gait belt Activity Tolerance: Patient tolerated treatment well Patient left: in chair;with call bell/phone within reach      Functional Assessment Tool Used: Clinical Judgement Functional Limitation: Mobility: Walking and moving around Mobility: Walking and Moving Around Current Status (K4401): At least 1 percent but less than 20 percent impaired, limited or restricted Mobility: Walking and Moving Around Goal Status [phone]): At least 1 percent but less than 20 percent impaired, limited or restricted    Time: 1503-1530 PT Time Calculation (min) (ACUTE ONLY): 27 min   Charges:   PT Evaluation $PT Eval Moderate Complexity: 1 Procedure PT Treatments $Gait Training: 8-22 mins   PT G Codes:   PT G-Codes **NOT FOR INPATIENT CLASS** Functional Assessment Tool Used: Clinical Judgement Functional Limitation: Mobility: Walking and moving around Mobility: Walking and Moving Around Current Status ([id number]): At least 1 percent but less than  20 percent impaired, limited or restricted Mobility: Walking and Moving Around Goal Status [phone]): At least 1 percent but less than 20 percent impaired, limited or restricted    Elray Mcgregor 02/01/2015, 4:01 PM  Sheran Lawless, PT [phone] 02/01/2015

## 2015-02-02 DIAGNOSIS — F3160 Bipolar disorder, current episode mixed, unspecified: Secondary | ICD-10-CM | POA: Diagnosis not present

## 2015-02-02 DIAGNOSIS — T56891D Toxic effect of other metals, accidental (unintentional), subsequent encounter: Secondary | ICD-10-CM | POA: Diagnosis not present

## 2015-02-02 DIAGNOSIS — E663 Overweight: Secondary | ICD-10-CM

## 2015-02-02 DIAGNOSIS — R251 Tremor, unspecified: Secondary | ICD-10-CM | POA: Diagnosis not present

## 2015-02-02 LAB — BASIC METABOLIC PANEL
ANION GAP: 7 (ref 5–15)
BUN: 8 mg/dL (ref 6–20)
CALCIUM: 8.8 mg/dL — AB (ref 8.9–10.3)
CHLORIDE: 109 mmol/L (ref 101–111)
CO2: 26 mmol/L (ref 22–32)
Creatinine, Ser: 0.94 mg/dL (ref 0.61–1.24)
GFR calc Af Amer: >60 mL/min (ref >60)
GFR calc non Af Amer: >60 mL/min (ref >60)
GLUCOSE: 106 mg/dL — AB (ref 65–99)
POTASSIUM: 4.1 mmol/L (ref 3.5–5.1)
Sodium: 142 mmol/L (ref 135–145)

## 2015-02-02 NOTE — Progress Notes (Signed)
Discharge instructions reviewed with pt and pt's family member.  Pt and pt's family member verbalized understanding and had no questions.  Pt discharged in stable condition via wheelchair with family member.  Hector ShadeMoss, Gamble Enderle [place]

## 2015-02-02 NOTE — Discharge Summary (Signed)
Discharge Summary  Roger Gonzalez [id number] DOB: 1965-12-18  PCP: Laren BoomHommel, Sean, DO  Admit date: 01/30/2015 Discharge date: 02/02/2015  Time spent:  25 minutes  Recommendations for Outpatient Follow-up:  1. Patient will have a follow-up appointment with [facility] in the next 2 weeks. 2. He will have a repeat lithium level drawn on Monday 1/16   Discharge Diagnoses:  Active Hospital Problems   Diagnosis Date Noted  . Lithium toxicity 01/30/2015  . Tremor 01/30/2015  . Bipolar 1 disorder, mixed (HCC) 05/07/2012    Resolved Hospital Problems   Diagnosis Date Noted Date Resolved  No resolved problems to display.    Discharge Condition: Improved, being discharged home   Diet recommendation: Regular diet   Filed Weights   01/30/15 1709 01/30/15 2143  Weight: 87.091 kg (192 lb) 81.874 kg (180 lb 8 oz)    History of present illness:  Patient is a 50 year old male with past medical history bipolar disorder and chronic tremor who is normally on lithium who states that his psychiatrist increased his dose of lithium to 22 weeks prior to admission. One week prior, patient noticed increased tremor, intermittent blurry vision and overall weakness. On day of admission, patient had a fall and had difficulty getting up by himself so he came into the emergency room. In the emergency room, renal function normal, but lithium level at 1.72. Patient was evaluated closely and after evaluation by ER doctor and separately by hospitalist, it was felt that patient had not intentionally tried to take more lithium. Patient's lithium was held in case was discussed with poison control who recommended monitoring and fluid hydration.   Hospital Course:  Principal Problem:   Lithium toxicity: Repeat lithium level the following day noted decline. Patient's symptoms continue to improve and by 1/11, stable. Seen by PT recommended outpatient physical therapy. Psychiatry evaluated patient and  recommended restarting lithium on his previous original dose of 450 mg (one tablet) twice a day on day of discharge and repeat lithium level checked in 5 days on 1/16 and follow up with outpatient psychiatry  Active Problems:   Bipolar 1 disorder, mixed (HCC): Stable. Patient will continue his other medications. Also see above.   Tremor: Stable, continued on propranolol  overweight: Patient meets criteria with BMI greater than 25  Procedures:  None  Consultations:  Psychiatry   Discharge Exam: BP 132/72 mmHg  Pulse 68  Temp(Src) 98.5 F (36.9 C) (Oral)  Resp 17  Ht 5\' 9"  (1.753 m)  Wt 81.874 kg (180 lb 8 oz)  BMI 26.64 kg/m2  SpO2 95%  General: Alert and oriented 3  Cardiovascular: Regular rate and rhythm, S1 and S2  Respiratory: Clear to auscultation bilaterally   Discharge Instructions You were cared for by a hospitalist during your hospital stay. If you have any questions about your discharge medications or the care you received while you were in the hospital after you are discharged, you can call the unit and asked to speak with the hospitalist on call if the hospitalist that took care of you is not available. Once you are discharged, your primary care physician will handle any further medical issues. Please note that NO REFILLS for any discharge medications will be authorized once you are discharged, as it is imperative that you return to your primary care physician (or establish a relationship with a primary care physician if you do not have one) for your aftercare needs so that they can reassess your need for medications and monitor your  lab values.  Discharge Instructions    Ambulatory referral to Physical Therapy    Complete by:  As directed      Diet - low sodium heart healthy    Complete by:  As directed      Increase activity slowly    Complete by:  As directed             Medication List    TAKE these medications        ALEVE 220 MG tablet  Generic drug:   naproxen sodium  Take 220 mg by mouth 2 (two) times daily as needed (pain).     buPROPion 150 MG 12 hr tablet  Commonly known as:  WELLBUTRIN SR  Take 300 mg by mouth daily.     lamoTRIgine 200 MG tablet  Commonly known as:  LAMICTAL  Take 200 mg by mouth 2 (two) times daily.     lithium carbonate 450 MG CR tablet  Commonly known as:  ESKALITH  Take 450 mg by mouth 2 (two) times daily.     multivitamin with minerals Tabs tablet  Take 1 tablet by mouth daily.     OVER THE COUNTER MEDICATION  Take 1 capsule by mouth at bedtime. On Guard for Immune system boost     propranolol ER 60 MG 24 hr capsule  Commonly known as:  INDERAL LA  Take 60 mg by mouth at bedtime.     REXULTI 3 MG Tabs  Generic drug:  Brexpiprazole  Take 3 mg by mouth at bedtime.       Allergies  Allergen Reactions  . Other Other (See Comments)    Steroids cause mania  . Bactrim [Sulfamethoxazole-Trimethoprim] Diarrhea    diarrhea  . Barbiturates Other (See Comments)    hyperactivity  . Penicillins Hives    Has patient had a PCN reaction causing immediate rash, facial/tongue/throat swelling, SOB or lightheadedness with hypotension: Yes Has patient had a PCN reaction causing severe rash involving mucus membranes or skin necrosis: No Has patient had a PCN reaction that required hospitalization No Has patient had a PCN reaction occurring within the last 10 years: Yes If all of the above answers are "NO", then may proceed with Cephalosporin use.       Follow-up Information    Schedule an appointment as soon as possible for a visit with [facility]   Specialty:  Rehabilitation   Why:  If they do not call you in 3 business days , call them for appointment    Contact information:   [address] [id number] mc [place] [address] [phone]      Follow up with [facility] In 1 week.   Specialty:  [facility] information:   [address] [place] [address] [phone]        The results of significant diagnostics from this hospitalization (including imaging, microbiology, ancillary and laboratory) are listed below for reference.    Significant Diagnostic Studies: No results found.  Microbiology: No results found for this or any previous visit (from the past 240 hour(s)).   Labs: Basic Metabolic Panel:  Recent Labs Lab 01/30/15 1740 01/31/15 0450 02/01/15 0331 02/02/15 0409  NA 141 142 143 142  K 4.0 4.0 4.1 4.1  CL 107 111 110 109  CO2 26 26 26 26   GLUCOSE 95 109* 103* 106*  BUN 14 14 8 8   CREATININE 1.10 1.12 1.02  0.94  CALCIUM 10.2 9.1 8.8* 8.8*   Liver Function Tests:  Recent Labs Lab 01/30/15 1740 01/31/15 0450  AST 19 19  ALT 27 28  ALKPHOS 77 61  BILITOT 0.7 0.4  PROT 6.3* 4.9*  ALBUMIN 4.2 3.2*   No results for input(s): LIPASE, AMYLASE in the last 168 hours. No results for input(s): AMMONIA in the last 168 hours. CBC:  Recent Labs Lab 01/30/15 1740 01/31/15 0450  WBC 9.1 7.5  NEUTROABS 6.6  --   HGB 17.0 13.9  HCT 49.1 41.9  MCV 90.4 91.9  PLT 218 154   Cardiac Enzymes: No results for input(s): CKTOTAL, CKMB, CKMBINDEX, TROPONINI in the last 168 hours. BNP: BNP (last 3 results) No results for input(s): BNP in the last 8760 hours.  ProBNP (last 3 results) No results for input(s): PROBNP in the last 8760 hours.  CBG: No results for input(s): GLUCAP in the last 168 hours.     Signed:  Hollice Espy  [facility] 02/02/2015, 5:02 PM

## 2015-02-02 NOTE — Evaluation (Signed)
Occupational Therapy Evaluation Patient Details Name: Roger Gonzalez MRN: [id number] DOB: 1965/08/17 Today's Date: 02/02/2015    History of Present Illness Roger Gonzalez is a 50 y.o. male with a past medical history significant for Bipolar I on lithium and chronic tremor who presents with fall and weakness.    Clinical Impression   Patient evaluated by Occupational Therapy with no further acute OT needs identified. All education has been completed and the patient has no further questions. See below for any follow-up Occupational Therapy or equipment needs. OT to sign off. Thank you for referral.   OUTPATIENT For balance deficits    Follow Up Recommendations  Outpatient OT    Equipment Recommendations  None recommended by OT    Recommendations for Other Services       Precautions / Restrictions Precautions Precautions: Fall      Mobility Bed Mobility Overal bed mobility: Modified Independent                Transfers Overall transfer level: Modified independent                    Balance Overall balance assessment: Needs assistance                           High level balance activites: Direction changes;Turns;Head turns High Level Balance Comments: supervision level with decr gait velocity noted Standardized Balance Assessment Standardized Balance Assessment : Berg Balance Test Berg Balance Test Sit to Stand: Able to stand without using hands and stabilize independently Standing Unsupported: Able to stand safely 2 minutes Standing Unsupported with Eyes Closed: Able to stand 10 seconds safely        ADL Overall ADL's : Needs assistance/impaired                         Toilet Transfer: Supervision/safety   Toileting- Clothing Manipulation and Hygiene: Supervision/safety   Tub/ Shower Transfer: Tub Scientist, clinical (histocompatibility and immunogenetics)transfer;Minimal assistance Tub/Shower Transfer Details (indicate cue type and reason): requires UE support and  reaching for supports. Recommend family (A) for transfers. Pt states "ill just wear bathing suit so my sister can help me Functional mobility during ADLs: Supervision/safety General ADL Comments: Educated dad and patient on fall risk with single leg standing ( dressing, shower, curb transfer and stairs) Recommend OUtpatient to address balance at higher levle. pt with static standing with vision occlusion sway indicating fall risk     Vision     Perception     Praxis      Pertinent Vitals/Pain       Hand Dominance Right   Extremity/Trunk Assessment Upper Extremity Assessment Upper Extremity Assessment: LUE deficits/detail RUE Deficits / Details: AROM WFL tremor noted LUE Deficits / Details: tremor AROM WFL guarding L Ue due to IV line   Lower Extremity Assessment Lower Extremity Assessment: Defer to PT evaluation   Cervical / Trunk Assessment Cervical / Trunk Assessment: Normal   Communication Communication Communication: No difficulties   Cognition Arousal/Alertness: Awake/alert Behavior During Therapy: WFL for tasks assessed/performed Overall Cognitive Status: Within Functional Limits for tasks assessed                     General Comments       Exercises       Shoulder Instructions      Home Living Family/patient expects to be discharged to:: Private residence Living Arrangements: Other relatives Available Help  at Discharge: Family Type of Home: House Home Access: Level entry     Home Layout: Multi-level Alternate Level Stairs-Number of Steps: 10 to lower level Alternate Level Stairs-Rails: Right Bathroom Shower/Tub: Chief Strategy Officer: Standard     Home Equipment: None          Prior Functioning/Environment Level of Independence: Independent        Comments: worked for [employer] Improvement in back stocking, lifting, driving fork lift, etc    OT Diagnosis:     OT Problem List:     OT Treatment/Interventions:       OT Goals(Current goals can be found in the care plan section) Acute Rehab OT Goals Patient Stated Goal: to return to work Potential to Achieve Goals: Good  OT Frequency:     Barriers to D/C:            Co-evaluation              End of Session Equipment Utilized During Treatment: Gait belt Nurse Communication: Mobility status;Precautions  Activity Tolerance: Patient tolerated treatment well Patient left: in bed;with call bell/phone within reach;with family/visitor present   Time: 1610-9604 OT Time Calculation (min): 15 min Charges:  OT General Charges $OT Visit: 1 Procedure OT Evaluation $OT Eval Moderate Complexity: 1 Procedure G-Codes:    Boone Master B 2015-02-13, 11:28 AM  Mateo Flow   OTR/L Pager: [phone] Office: [phone] .

## 2015-02-02 NOTE — Discharge Instructions (Signed)
Repeat Lithium Level on Monday 1/16

## 2015-02-03 MED FILL — Brexpiprazole Tab 3 MG: ORAL | Qty: 1 | Status: AC

## 2015-02-11 NOTE — Progress Notes (Signed)
OT NOTE- LATE GCODE ENTRY    02/02/15 1000  OT G-codes **NOT FOR INPATIENT CLASS**  Functional Assessment Tool Used clinical judgement  Functional Limitation Self care  Self Care Current Status [phone]) CI  Self Care Goal Status (U0454) CI  Self Care Discharge Status [phone]) CI          Mateo Flow   OTR/L Pager: [phone] Office: [phone] .

## 2015-10-18 DIAGNOSIS — F606 Avoidant personality disorder: Secondary | ICD-10-CM | POA: Diagnosis not present

## 2015-10-18 DIAGNOSIS — F3132 Bipolar disorder, current episode depressed, moderate: Secondary | ICD-10-CM | POA: Diagnosis not present

## 2015-11-07 DIAGNOSIS — F3132 Bipolar disorder, current episode depressed, moderate: Secondary | ICD-10-CM | POA: Diagnosis not present

## 2015-12-22 DIAGNOSIS — F3131 Bipolar disorder, current episode depressed, mild: Secondary | ICD-10-CM | POA: Diagnosis not present

## 2015-12-22 DIAGNOSIS — F606 Avoidant personality disorder: Secondary | ICD-10-CM | POA: Diagnosis not present

## 2016-01-25 ENCOUNTER — Ambulatory Visit: Payer: Self-pay | Admitting: Physician Assistant

## 2016-01-25 ENCOUNTER — Other Ambulatory Visit: Payer: Self-pay | Admitting: Physician Assistant

## 2016-01-30 ENCOUNTER — Ambulatory Visit (INDEPENDENT_AMBULATORY_CARE_PROVIDER_SITE_OTHER): Payer: BLUE CROSS/BLUE SHIELD | Admitting: Physician Assistant

## 2016-01-30 ENCOUNTER — Encounter: Payer: Self-pay | Admitting: Physician Assistant

## 2016-01-30 VITALS — BP 127/79 | HR 69 | Ht 69.0 in | Wt 179.0 lb

## 2016-01-30 DIAGNOSIS — F5221 Male erectile disorder: Secondary | ICD-10-CM

## 2016-01-30 DIAGNOSIS — Z0000 Encounter for general adult medical examination without abnormal findings: Secondary | ICD-10-CM

## 2016-01-30 DIAGNOSIS — E785 Hyperlipidemia, unspecified: Secondary | ICD-10-CM | POA: Diagnosis not present

## 2016-01-30 LAB — COMPLETE METABOLIC PANEL WITH GFR
ALT: 24 U/L (ref 9–46)
AST: 15 U/L (ref 10–40)
Albumin: 4.3 g/dL (ref 3.6–5.1)
Alkaline Phosphatase: 62 U/L (ref 40–115)
BUN: 14 mg/dL (ref 7–25)
CALCIUM: 9.4 mg/dL (ref 8.6–10.3)
CHLORIDE: 106 mmol/L (ref 98–110)
CO2: 27 mmol/L (ref 20–31)
Creat: 0.86 mg/dL (ref 0.60–1.35)
GFR, Est Non African American: >89 mL/min (ref >=60)
Glucose, Bld: 100 mg/dL — ABNORMAL HIGH (ref 65–99)
POTASSIUM: 4.5 mmol/L (ref 3.5–5.3)
Sodium: 140 mmol/L (ref 135–146)
Total Bilirubin: 0.8 mg/dL (ref 0.2–1.2)
Total Protein: 6.5 g/dL (ref 6.1–8.1)

## 2016-01-30 LAB — CBC
HEMATOCRIT: 50.9 % — AB (ref 38.5–50.0)
HEMOGLOBIN: 17.5 g/dL — AB (ref 13.2–17.1)
MCH: 30.9 pg (ref 27.0–33.0)
MCHC: 34.4 g/dL (ref 32.0–36.0)
MCV: 89.8 fL (ref 80.0–100.0)
MPV: 12.5 fL (ref 7.5–12.5)
Platelets: 176 10*3/uL (ref 140–400)
RBC: 5.67 MIL/uL (ref 4.20–5.80)
RDW: 13.4 % (ref 11.0–15.0)
WBC: 6.2 10*3/uL (ref 3.8–10.8)

## 2016-01-30 LAB — LIPID PANEL
CHOL/HDL RATIO: 4.7 ratio (ref <5.0)
CHOLESTEROL: 198 mg/dL (ref <200)
HDL: 42 mg/dL (ref >40)
LDL Cholesterol: 136 mg/dL — ABNORMAL HIGH (ref <100)
TRIGLYCERIDES: 99 mg/dL (ref <150)
VLDL: 20 mg/dL (ref <30)

## 2016-01-30 LAB — TSH: TSH: 0.53 mIU/L (ref 0.40–4.50)

## 2016-01-30 LAB — HEMOGLOBIN A1C
Hgb A1c MFr Bld: 5 % (ref <5.7)
MEAN PLASMA GLUCOSE: 97 mg/dL

## 2016-01-30 MED ORDER — SILDENAFIL CITRATE 20 MG PO TABS
ORAL_TABLET | ORAL | 11 refills | Status: DC
Start: 2016-01-30 — End: 2019-07-08

## 2016-01-30 NOTE — Patient Instructions (Addendum)
Bring prescription to [facility] [address], [place], [place] [id number]  I have also ordered fasting labs. The lab is a walk-in open M-F 8a-5p (closed 12:30-1:30p). Nothing to eat or drink after midnight or at least 8 hours before your blood draw. You can have water and your medications.  Erectile Dysfunction Erectile dysfunction is the inability to get or sustain a good enough erection to have sexual intercourse. Erectile dysfunction may involve:  Inability to get an erection.  Lack of enough hardness to allow penetration.  Loss of the erection before sex is finished.  Premature ejaculation. CAUSES  Certain drugs, such as:  Pain relievers.  Antihistamines.  Antidepressants.  Blood pressure medicines.  Water pills (diuretics).  Ulcer medicines.  Muscle relaxants.  Illegal drugs.  Excessive drinking.  Psychological causes, such as:  Anxiety.  Depression.  Sadness.  Exhaustion.  Performance fear.  Stress.  Physical causes, such as:  Artery problems. This may include diabetes, smoking, liver disease, or atherosclerosis.  High blood pressure.  Hormonal problems, such as low testosterone.  Obesity.  Nerve problems. This may include back or pelvic injuries, diabetes mellitus, multiple sclerosis, or Parkinson disease. SYMPTOMS  Inability to get an erection.  Lack of enough hardness to allow penetration.  Loss of the erection before sex is finished.  Premature ejaculation.  Normal erections at some times, but with frequent unsatisfactory episodes.  Orgasms that are not satisfactory in sensation or frequency.  Low sexual satisfaction in either partner because of erection problems.  A curved penis occurring with erection. The curve may cause pain or may be too curved to allow for intercourse.  Never having nighttime erections. DIAGNOSIS Your caregiver can often diagnose this condition by:  Performing a physical exam to find other  diseases or specific problems with the penis.  Asking you detailed questions about the problem.  Performing blood tests to check for diabetes mellitus or to measure hormone levels.  Performing urine tests to find other underlying health conditions.  Performing an ultrasound exam to check for scarring.  Performing a test to check blood flow to the penis.  Doing a sleep study at home to measure nighttime erections. TREATMENT   You may be prescribed medicines by mouth.  You may be given medicine injections into the penis.  You may be prescribed a vacuum pump with a ring.  Penile implant surgery may be performed. You may receive:  An inflatable implant.  A semirigid implant.  Blood vessel surgery may be performed. HOME CARE INSTRUCTIONS  If you are prescribed oral medicine, you should take the medicine as prescribed. Do not increase the dosage without first discussing it with your physician.  If you are using self-injections, be careful to avoid any veins that are on the surface of the penis. Apply pressure to the injection site for 5 minutes.  If you are using a vacuum pump, make sure you have read the instructions before using it. Discuss any questions with your physician before taking the pump home. SEEK MEDICAL CARE IF:  You experience pain that is not responsive to the pain medicine you have been prescribed.  You experience nausea or vomiting. SEEK IMMEDIATE MEDICAL CARE IF:   When taking oral or injectable medications, you experience an erection that lasts longer than 4 hours. If your physician is unavailable, go to the nearest emergency room for evaluation. An erection that lasts much longer than 4 hours can result in permanent damage to your penis.  You have pain that  is severe.  You develop redness, severe pain, or severe swelling of your penis.  You have redness spreading up into your groin or lower abdomen.  You are unable to pass your urine. This information  is not intended to replace advice given to you by your health care provider. Make sure you discuss any questions you have with your health care provider. Document Released: 01/06/2000 Document Revised: 09/10/2012 Document Reviewed: 06/12/2012 Elsevier Interactive Patient Education  2017 [employer].

## 2016-01-30 NOTE — Progress Notes (Signed)
HPI:                                                                Roger Gonzalez is a 51 y.o. male who [facility] today to establish care   Bipolar Depression/GAD: taking Abilify nightly. Patient was hospitalized 1 year ago for Lithium toxicity. He is no longer taking Lithium. He is followed by Psychiatry. Next appointment is in February.   OSA: not using BIPAP nightly due to cost.  ED: patient reports inability to achieve an erection x 2 years. Denies urinary symptoms. Endorses lack of energy, but attributes this to his depression. Reports normal sex drive.    HLD: ASCVD risk 3.7% (based on 2014 labs). - patient without history of CAD. Denies chest pain with exertion. Family hx significant for HTN in his mother. Lab Results  Component Value Date   CHOL 182 04/23/2012   HDL 37 (L) 04/23/2012   LDLCALC 132 (H) 04/23/2012   TRIG 65 04/23/2012   CHOLHDL 4.9 04/23/2012     Declined influenza vaccine today  Health Maintenance Health Maintenance  Topic Date Due  . HIV Screening  12/01/1981  . INFLUENZA VACCINE  01/29/2017 (Originally 08/23/2015)  . TETANUS/TDAP  03/23/2019    Health Habits  Diet: has difficulty affording health foods  Exercise: walks at work  ETOH: no  Tobacco: no  Drugs: no  Dental Exam: yes, up to date  Eye Exam: due  Past Medical History:  Diagnosis Date  . BIPOLAR DISORDER UNSPECIFIED 11/29/2009  . GERD (gastroesophageal reflux disease)   . UNSPECIFIED HYPOGAMMAGLOBULINEMIA 05/30/2009   Qualifier: Diagnosis of  By: Thomos LemonsBowen DO, Karen     Past Surgical History:  Procedure Laterality Date  . TONSILLECTOMY     Social History  Substance Use Topics  . Smoking status: Former Games developermoker  . Smokeless tobacco: Former NeurosurgeonUser    Quit date: 05/05/2000  . Alcohol use No   family history includes Bipolar disorder in his father; Hypertension in his mother.  Review of Systems  Constitutional:  Negative for chills, fever and weight loss.  HENT: Negative.   Eyes: Positive for blurred vision (wears corrective lenses).  Respiratory: Negative for cough and shortness of breath.        Sleep apnea  Cardiovascular: Negative for chest pain, palpitations, claudication and leg swelling.  Gastrointestinal: Negative.   Genitourinary: Positive for urgency.  Musculoskeletal: Negative.   Skin: Negative.   Neurological: Negative for dizziness and headaches.  Psychiatric/Behavioral: Positive for depression. Negative for hallucinations, substance abuse and suicidal ideas. The patient is nervous/anxious.      Medications: Current Outpatient Prescriptions  Medication Sig Dispense Refill  . ARIPiprazole (ABILIFY) 10 MG tablet Take 10 mg by mouth at bedtime.     No current facility-administered medications for this visit.    Allergies  Allergen Reactions  . Other Other (See Comments)    Steroids cause mania  . Bactrim [Sulfamethoxazole-Trimethoprim] Diarrhea    diarrhea  . Barbiturates Other (See Comments)    hyperactivity  . Penicillins Hives    Has patient had a PCN reaction causing immediate rash, facial/tongue/throat swelling, SOB or lightheadedness with hypotension: Yes Has patient had a PCN reaction causing severe rash involving mucus membranes  or skin necrosis: No Has patient had a PCN reaction that required hospitalization No Has patient had a PCN reaction occurring within the last 10 years: Yes If all of the above answers are "NO", then may proceed with Cephalosporin use.       Objective:  BP 127/79   Pulse 69   Ht 5\' 9"  (1.753 m)   Wt 179 lb (81.2 kg)   BMI 26.43 kg/m  Gen: well-groomed, cooperative, not ill-appearing, no distress HEENT: normal conjunctiva, TM's clear, oropharynx clear, moist mucus membranes, no thyromegaly or tenderness Lungs: Normal work of breathing, clear to auscultation bilaterally Heart: Normal rate, regular rhythm, s1 and s2 distinct, no  murmurs, clicks or rubs appreciated on this exam, no carotid bruit Abd: Soft. Nondistended, Nontender Neuro: alert and oriented x 3, EOM's intact, PERRLA, DTR's intact MSK: strength 5/5 and symmetric, normal gait Extremities: distal pulses intact, no peripheral edema Skin: warm and dry, no rashes or lesions on exposed skin Psych: normal affect, no mania, pleasant mood, normal speech and thought content   No results found for this or any previous visit (from the past 72 hour(s)). No results found.    Assessment and Plan: 51 y.o. male with   Encounter for preventative adult health care examination - COMPLETE METABOLIC PANEL WITH GFR - CBC - Hemoglobin A1c - Lipid panel - VITAMIN D 25 Hydroxy (Vit-D Deficiency, Fractures) - TSH - Testosterone Total,Free,Bio, Males  Erectile disorder, acquired, generalized, severe - Sildenafil 20mg  prn  Hyperlipidemia with target LDL less than 160 - rechecking fasting lipids today  Severe OSA - patient aware of the risks of not using BiPAP  Patient education and anticipatory guidance given Patient agrees with treatment plan Follow-up in 1 year or sooner as needed  Levonne Hubert PA-C

## 2016-01-31 LAB — TESTOSTERONE TOTAL,FREE,BIO, MALES
Albumin: 4.3 g/dL (ref 3.6–5.1)
SEX HORMONE BINDING: 21 nmol/L (ref 10–50)
TESTOSTERONE BIOAVAILABLE: 138.9 ng/dL (ref 110.0–575.0)
TESTOSTERONE FREE: 70.5 pg/mL (ref 46.0–224.0)
Testosterone: 377 ng/dL (ref 250–827)

## 2016-01-31 LAB — VITAMIN D 25 HYDROXY (VIT D DEFICIENCY, FRACTURES): Vit D, 25-Hydroxy: 26 ng/mL — ABNORMAL LOW (ref 30–100)

## 2016-02-16 ENCOUNTER — Telehealth: Payer: Self-pay | Admitting: *Deleted

## 2016-02-16 NOTE — Telephone Encounter (Signed)
PA submitted through covermymeds for gen Revatio [id number]

## 2016-03-01 DIAGNOSIS — F606 Avoidant personality disorder: Secondary | ICD-10-CM | POA: Diagnosis not present

## 2016-03-01 DIAGNOSIS — F3131 Bipolar disorder, current episode depressed, mild: Secondary | ICD-10-CM | POA: Diagnosis not present

## 2016-03-01 DIAGNOSIS — F410 Panic disorder [episodic paroxysmal anxiety] without agoraphobia: Secondary | ICD-10-CM | POA: Diagnosis not present

## 2016-03-30 NOTE — Telephone Encounter (Signed)
Upon f/u Deniedon January 25  PA has been Denied PA has been Denied. Looking back at staff messeages between [place] and I she had informed the patient to [facility] and I also [facility] but I think the patient just decided to take this medication to another pharmacy  Since  the PA came from [facility]. Closing encounter.

## 2016-03-30 NOTE — Telephone Encounter (Signed)
The rx was printed the day the day it was written

## 2016-07-03 DIAGNOSIS — F3131 Bipolar disorder, current episode depressed, mild: Secondary | ICD-10-CM | POA: Diagnosis not present

## 2016-07-03 DIAGNOSIS — F410 Panic disorder [episodic paroxysmal anxiety] without agoraphobia: Secondary | ICD-10-CM | POA: Diagnosis not present

## 2016-07-03 DIAGNOSIS — F606 Avoidant personality disorder: Secondary | ICD-10-CM | POA: Diagnosis not present

## 2017-01-30 DIAGNOSIS — F606 Avoidant personality disorder: Secondary | ICD-10-CM | POA: Diagnosis not present

## 2017-01-30 DIAGNOSIS — F410 Panic disorder [episodic paroxysmal anxiety] without agoraphobia: Secondary | ICD-10-CM | POA: Diagnosis not present

## 2017-01-30 DIAGNOSIS — F3132 Bipolar disorder, current episode depressed, moderate: Secondary | ICD-10-CM | POA: Diagnosis not present

## 2018-10-12 DIAGNOSIS — Z20828 Contact with and (suspected) exposure to other viral communicable diseases: Secondary | ICD-10-CM | POA: Diagnosis not present

## 2019-07-08 ENCOUNTER — Ambulatory Visit (INDEPENDENT_AMBULATORY_CARE_PROVIDER_SITE_OTHER): Payer: BC Managed Care – PPO | Admitting: Family Medicine

## 2019-07-08 ENCOUNTER — Other Ambulatory Visit: Payer: Self-pay

## 2019-07-08 ENCOUNTER — Encounter: Payer: Self-pay | Admitting: Family Medicine

## 2019-07-08 VITALS — BP 151/90 | HR 81 | Temp 98.0°F | Ht 68.9 in | Wt 201.3 lb

## 2019-07-08 DIAGNOSIS — G4733 Obstructive sleep apnea (adult) (pediatric): Secondary | ICD-10-CM

## 2019-07-08 DIAGNOSIS — Z0000 Encounter for general adult medical examination without abnormal findings: Secondary | ICD-10-CM | POA: Insufficient documentation

## 2019-07-08 DIAGNOSIS — Z13220 Encounter for screening for lipoid disorders: Secondary | ICD-10-CM | POA: Diagnosis not present

## 2019-07-08 DIAGNOSIS — F5221 Male erectile disorder: Secondary | ICD-10-CM

## 2019-07-08 DIAGNOSIS — F3160 Bipolar disorder, current episode mixed, unspecified: Secondary | ICD-10-CM | POA: Diagnosis not present

## 2019-07-08 DIAGNOSIS — Z130 Encounter for screening for diseases of the blood and blood-forming organs and certain disorders involving the immune mechanism: Secondary | ICD-10-CM | POA: Insufficient documentation

## 2019-07-08 MED ORDER — SILDENAFIL CITRATE 100 MG PO TABS: 50.0000 mg | ORAL_TABLET | Freq: Every day | ORAL | 11 refills | Status: DC | PRN

## 2019-07-08 NOTE — Assessment & Plan Note (Signed)
Previously followed by pyschiaty however he denies any continued problems related to this.  HE has had some sleep difficulty as well as rapid /pressured speech today.  Recommend that he see psychiatry again or restart abilify both of which he declines.

## 2019-07-08 NOTE — Progress Notes (Signed)
Roger Gonzalez - 54 y.o. male MRN [id number]  Date of birth: 03/25/1965  Subjective Chief Complaint  Patient presents with  . Annual Exam    HPI Roger Gonzalez is a 54 y.o. male here today for annual exam.  He has not been seen in our clinic in a few years.  He has history of bipolar disorder.  Previously followed by psychiatry and treated with abilify.  He was also on lithium at one point but this was discontinued after experiencing toxicity related to this.  He is no longer seeing psychiatry or taking medications for his bipolar d/o.  He reports that he divorced his wife and this improved his symptoms.  He reports that he has not been sleeping well over the past few days, only getting about 4 hours of sleep per night. He does have history of OSA but insurance would not cover CPAP previously.  He would be interested in following up with pulmonary clinic that initially did sleep study.   He does report that he stays pretty active.  He does not always follow a  Healthy diet.    He is a non-smoker.  He denies EtOH use.   Review of Systems  Constitutional: Negative for chills, fever, malaise/fatigue and weight loss.  HENT: Negative for congestion, ear pain and sore throat.   Eyes: Negative for blurred vision, double vision and pain.  Respiratory: Negative for cough and shortness of breath.   Cardiovascular: Negative for chest pain and palpitations.  Gastrointestinal: Negative for abdominal pain, blood in stool, constipation, heartburn and nausea.  Genitourinary: Negative for dysuria and urgency.  Musculoskeletal: Negative for joint pain and myalgias.  Neurological: Negative for dizziness and headaches.  Endo/Heme/Allergies: Does not bruise/bleed easily.  Psychiatric/Behavioral: Negative for depression. The patient is not nervous/anxious and does not have insomnia.     Allergies  Allergen Reactions  . Other Other (See Comments)    Steroids cause mania  . Bactrim  [Sulfamethoxazole-Trimethoprim] Diarrhea    diarrhea  . Barbiturates Other (See Comments)    hyperactivity  . Penicillins Hives    Has patient had a PCN reaction causing immediate rash, facial/tongue/throat swelling, SOB or lightheadedness with hypotension: Yes Has patient had a PCN reaction causing severe rash involving mucus membranes or skin necrosis: No Has patient had a PCN reaction that required hospitalization No Has patient had a PCN reaction occurring within the last 10 years: Yes If all of the above answers are "NO", then may proceed with Cephalosporin use.    Past Medical History:  Diagnosis Date  . BIPOLAR DISORDER UNSPECIFIED 11/29/2009  . GERD (gastroesophageal reflux disease)   . UNSPECIFIED HYPOGAMMAGLOBULINEMIA 05/30/2009   Qualifier: Diagnosis of  By: Thomos Lemons      Past Surgical History:  Procedure Laterality Date  . TONSILLECTOMY      Social History   Socioeconomic History  . Marital status: Single    Spouse name: Not on file  . Number of children: Not on file  . Years of education: Not on file  . Highest education level: Not on file  Occupational History  . Not on file  Tobacco Use  . Smoking status: Former Games developer  . Smokeless tobacco: Former Neurosurgeon    Quit date: 05/05/2000  Substance and Sexual Activity  . Alcohol use: No    Alcohol/week: 0.0 standard drinks  . Drug use: No  . Sexual activity: Not on file  Other Topics Concern  . Not on file  Social History  Narrative  . Not on file   Social Determinants of Health   Financial Resource Strain:   . Difficulty of Paying Living Expenses:   Food Insecurity:   . Worried About Programme researcher, broadcasting/film/video in the Last Year:   . Barista in the Last Year:   Transportation Needs:   . Freight forwarder (Medical):   Marland Kitchen Lack of Transportation (Non-Medical):   Physical Activity:   . Days of Exercise per Week:   . Minutes of Exercise per Session:   Stress:   . Feeling of Stress :   Social  Connections:   . Frequency of Communication with Friends and Family:   . Frequency of Social Gatherings with Friends and Family:   . Attends Religious Services:   . Active Member of Clubs or Organizations:   . Attends Banker Meetings:   Marland Kitchen Marital Status:     Family History  Problem Relation Age of Onset  . Hypertension Mother   . Bipolar disorder Father     Health Maintenance  Topic Date Due  . Hepatitis C Screening  Never done  . COVID-19 Vaccine (1) Never done  . HIV Screening  Never done  . COLONOSCOPY  Never done  . TETANUS/TDAP  03/23/2019  . INFLUENZA VACCINE  08/23/2019     ----------------------------------------------------------------------------------------------------------------------------------------------------------------------------------------------------------------- Physical Exam BP (!) 151/90 (BP Location: Left Arm, Patient Position: Sitting, Cuff Size: Large)   Pulse 81   Temp 98 F (36.7 C) (Temporal)   Ht 5' 8.9" (1.75 m)   Wt 201 lb 4.8 oz (91.3 kg)   SpO2 91%   BMI 29.81 kg/m   Physical Exam Constitutional:      General: He is not in acute distress. HENT:     Head: Normocephalic and atraumatic.     Right Ear: External ear normal.     Left Ear: External ear normal.  Eyes:     General: No scleral icterus. Neck:     Thyroid: No thyromegaly.  Cardiovascular:     Rate and Rhythm: Normal rate and regular rhythm.     Heart sounds: Normal heart sounds.  Pulmonary:     Effort: Pulmonary effort is normal.     Breath sounds: Normal breath sounds.  Abdominal:     General: Bowel sounds are normal. There is no distension.     Palpations: Abdomen is soft.     Tenderness: There is no abdominal tenderness. There is no guarding.  Musculoskeletal:     Cervical back: Normal range of motion.  Lymphadenopathy:     Cervical: No cervical adenopathy.  Skin:    General: Skin is warm and dry.     Findings: No rash.  Neurological:      Mental Status: He is alert and oriented to person, place, and time.     Cranial Nerves: No cranial nerve deficit.     Motor: No abnormal muscle tone.  Psychiatric:        Mood and Affect: Mood and affect normal.        Speech: Speech is rapid and pressured.        Behavior: Behavior normal.     ------------------------------------------------------------------------------------------------------------------------------------------------------------------------------------------------------------------- Assessment and Plan  Bipolar 1 disorder, mixed (HCC) Previously followed by pyschiaty however he denies any continued problems related to this.  HE has had some sleep difficulty as well as rapid /pressured speech today.  Recommend that he see psychiatry again or restart abilify both of which he declines.  Severe obstructive sleep apnea Will refer back to lung and sleep wellness.   Erectile disorder, acquired, generalized, severe Sildenafil renewed.   Well adult exam Well adult Orders Placed This Encounter  Procedures  . COMPLETE METABOLIC PANEL WITH GFR  . CBC  . Lipid Profile  Screening: Lipid panel.  He is undecided on colonoscopy vs cologuard.  HE will let us know.  Immunizations;  UTD Anticipatory guidance:  Recommendations per AVS.  See other problems discussed today as well.    Meds ordered this encounter  Medications  . sildenafil (VIAGRA) 100 MG tablet    Sig: Take 0.5-1 tablets (50-100 mg total) by mouth daily as needed for erectile dysfunction.    Dispense:  30 tablet    Refill:  11    No follow-ups on file.    This visit occurred during the SARS-CoV-2 public health emergency.  Safety protocols were in place, including screening questions prior to the visit, additional usage of staff PPE, and extensive cleaning of exam room while observing appropriate contact time as indicated for disinfecting solutions.

## 2019-07-08 NOTE — Assessment & Plan Note (Signed)
Well adult Orders Placed This Encounter  Procedures  . COMPLETE METABOLIC PANEL WITH GFR  . CBC  . Lipid Profile  Screening: Lipid panel.  He is undecided on colonoscopy vs cologuard.  HE will let us know.  Immunizations;  UTD Anticipatory guidance:  Recommendations per AVS.  See other problems discussed today as well.

## 2019-07-08 NOTE — Progress Notes (Signed)
Patient states fully vaccinated with J&J. Dates not available. Advised callback with info.

## 2019-07-08 NOTE — Patient Instructions (Signed)
For sildenafil (viagra):  Download GoodRx app.  Have Kristopher Oppenheim run the medication without your insurance and use the GoodRx app to help reduce price.  Have labs completed.   Return in 2 weeks for BP check   Preventive Care 53-53 Years Old, Male Preventive care refers to lifestyle choices and visits with your health care provider that can promote health and wellness. This includes:  A yearly physical exam. This is also called an annual well check.  Regular dental and eye exams.  Immunizations.  Screening for certain conditions.  Healthy lifestyle choices, such as eating a healthy diet, getting regular exercise, not using drugs or products that contain nicotine and tobacco, and limiting alcohol use. What can I expect for my preventive care visit? Physical exam Your health care provider will check:  Height and weight. These may be used to calculate body mass index (BMI), which is a measurement that tells if you are at a healthy weight.  Heart rate and blood pressure.  Your skin for abnormal spots. Counseling Your health care provider may ask you questions about:  Alcohol, tobacco, and drug use.  Emotional well-being.  Home and relationship well-being.  Sexual activity.  Eating habits.  Work and work Statistician. What immunizations do I need?  Influenza (flu) vaccine  This is recommended every year. Tetanus, diphtheria, and pertussis (Tdap) vaccine  You may need a Td booster every 10 years. Varicella (chickenpox) vaccine  You may need this vaccine if you have not already been vaccinated. Zoster (shingles) vaccine  You may need this after age 53. Measles, mumps, and rubella (MMR) vaccine  You may need at least one dose of MMR if you were born in 1957 or later. You may also need a second dose. Pneumococcal conjugate (PCV13) vaccine  You may need this if you have certain conditions and were not previously vaccinated. Pneumococcal polysaccharide (PPSV23)  vaccine  You may need one or two doses if you smoke cigarettes or if you have certain conditions. Meningococcal conjugate (MenACWY) vaccine  You may need this if you have certain conditions. Hepatitis A vaccine  You may need this if you have certain conditions or if you travel or work in places where you may be exposed to hepatitis A. Hepatitis B vaccine  You may need this if you have certain conditions or if you travel or work in places where you may be exposed to hepatitis B. Haemophilus influenzae type b (Hib) vaccine  You may need this if you have certain risk factors. Human papillomavirus (HPV) vaccine  If recommended by your health care provider, you may need three doses over 6 months. You may receive vaccines as individual doses or as more than one vaccine together in one shot (combination vaccines). Talk with your health care provider about the risks and benefits of combination vaccines. What tests do I need? Blood tests  Lipid and cholesterol levels. These may be checked every 5 years, or more frequently if you are over 53 years old.  Hepatitis C test.  Hepatitis B test. Screening  Lung cancer screening. You may have this screening every year starting at age 53 if you have a 30-pack-year history of smoking and currently smoke or have quit within the past 15 years.  Prostate cancer screening. Recommendations will vary depending on your family history and other risks.  Colorectal cancer screening. All adults should have this screening starting at age 53 and continuing until age 53. Your health care provider may recommend screening at age  if you are at increased risk. You will have tests every 1-10 years, depending on your results and the type of screening test.  Diabetes screening. This is done by checking your blood sugar (glucose) after you have not eaten for a while (fasting). You may have this done every 1-3 years.  Sexually transmitted disease (STD)  testing. Follow these instructions at home: Eating and drinking  Eat a diet that includes fresh fruits and vegetables, whole grains, lean protein, and low-fat dairy products.  Take vitamin and mineral supplements as recommended by your health care provider.  Do not drink alcohol if your health care provider tells you not to drink.  If you drink alcohol: ? Limit how much you have to 0-2 drinks a day. ? Be aware of how much alcohol is in your drink. In the [place] one drink equals one 12 oz bottle of beer (355 mL), one 5 oz glass of wine (148 mL), or one 1 oz glass of hard liquor (44 mL). Lifestyle  Take daily care of your teeth and gums.  Stay active. Exercise for at least 30 minutes on 5 or more days each week.  Do not use any products that contain nicotine or tobacco, such as cigarettes, e-cigarettes, and chewing tobacco. If you need help quitting, ask your health care provider.  If you are sexually active, practice safe sex. Use a condom or other form of protection to prevent STIs (sexually transmitted infections).  Talk with your health care provider about taking a low-dose aspirin every day starting at age 50. What's next?  Go to your health care provider once a year for a well check visit.  Ask your health care provider how often you should have your eyes and teeth checked.  Stay up to date on all vaccines. This information is not intended to replace advice given to you by your health care provider. Make sure you discuss any questions you have with your health care provider. Document Revised: 01/02/2018 Document Reviewed: 01/02/2018 Elsevier Patient Education  2020 Elsevier Inc.  

## 2019-07-08 NOTE — Assessment & Plan Note (Signed)
Sildenafil renewed

## 2019-07-08 NOTE — Assessment & Plan Note (Signed)
Will refer back to lung and sleep wellness.

## 2019-07-22 ENCOUNTER — Ambulatory Visit: Payer: BC Managed Care – PPO

## 2019-08-01 DIAGNOSIS — I861 Scrotal varices: Secondary | ICD-10-CM | POA: Diagnosis not present

## 2019-08-01 DIAGNOSIS — R21 Rash and other nonspecific skin eruption: Secondary | ICD-10-CM | POA: Diagnosis not present

## 2019-08-01 DIAGNOSIS — F319 Bipolar disorder, unspecified: Secondary | ICD-10-CM | POA: Diagnosis not present

## 2019-08-01 DIAGNOSIS — Z888 Allergy status to other drugs, medicaments and biological substances status: Secondary | ICD-10-CM | POA: Diagnosis not present

## 2019-08-01 DIAGNOSIS — Z880 Allergy status to penicillin: Secondary | ICD-10-CM | POA: Diagnosis not present

## 2019-08-01 DIAGNOSIS — N5082 Scrotal pain: Secondary | ICD-10-CM | POA: Diagnosis not present

## 2019-08-01 DIAGNOSIS — M545 Low back pain: Secondary | ICD-10-CM | POA: Diagnosis not present

## 2019-08-01 DIAGNOSIS — Z882 Allergy status to sulfonamides status: Secondary | ICD-10-CM | POA: Diagnosis not present

## 2019-08-01 DIAGNOSIS — N5089 Other specified disorders of the male genital organs: Secondary | ICD-10-CM | POA: Diagnosis not present

## 2019-08-01 DIAGNOSIS — Z79899 Other long term (current) drug therapy: Secondary | ICD-10-CM | POA: Diagnosis not present

## 2019-08-01 DIAGNOSIS — Z87891 Personal history of nicotine dependence: Secondary | ICD-10-CM | POA: Diagnosis not present

## 2019-08-01 DIAGNOSIS — N433 Hydrocele, unspecified: Secondary | ICD-10-CM | POA: Diagnosis not present

## 2019-08-05 ENCOUNTER — Other Ambulatory Visit: Payer: Self-pay

## 2019-08-05 ENCOUNTER — Encounter: Payer: Self-pay | Admitting: Family Medicine

## 2019-08-05 ENCOUNTER — Ambulatory Visit (INDEPENDENT_AMBULATORY_CARE_PROVIDER_SITE_OTHER): Payer: BC Managed Care – PPO | Admitting: Family Medicine

## 2019-08-05 DIAGNOSIS — I10 Essential (primary) hypertension: Secondary | ICD-10-CM

## 2019-08-05 DIAGNOSIS — N50811 Right testicular pain: Secondary | ICD-10-CM | POA: Diagnosis not present

## 2019-08-05 DIAGNOSIS — R21 Rash and other nonspecific skin eruption: Secondary | ICD-10-CM

## 2019-08-05 DIAGNOSIS — N50812 Left testicular pain: Secondary | ICD-10-CM

## 2019-08-05 DIAGNOSIS — M545 Low back pain, unspecified: Secondary | ICD-10-CM

## 2019-08-05 DIAGNOSIS — N50819 Testicular pain, unspecified: Secondary | ICD-10-CM | POA: Insufficient documentation

## 2019-08-05 MED ORDER — CYCLOBENZAPRINE HCL 10 MG PO TABS: 10.0000 mg | ORAL_TABLET | Freq: Three times a day (TID) | ORAL | 1 refills | Status: AC | PRN

## 2019-08-05 MED ORDER — AMLODIPINE BESYLATE 5 MG PO TABS: 5.0000 mg | ORAL_TABLET | Freq: Every day | ORAL | 3 refills | Status: DC

## 2019-08-05 NOTE — Assessment & Plan Note (Signed)
BP elevated on several occasions.  Will start amlodipine 5mg  daily.  Recommend reduced sodium diet and regular exercise.   Return in about 6 weeks (around 09/16/2019) for HTN.

## 2019-08-05 NOTE — Assessment & Plan Note (Signed)
[place] without significant findings to explain pain, notably no torsion or mass.   Improved at this time. He will let me know if symptoms return.

## 2019-08-05 NOTE — Assessment & Plan Note (Signed)
Serology for HSV2 positive but without swabbing lesion, can't definitively tell.  He has had improvement with valtrex, encouraged to complete.

## 2019-08-05 NOTE — Assessment & Plan Note (Signed)
Improved with naproxen and flexeril.  Continue for now as needed. Flexeril renewed.

## 2019-08-05 NOTE — Progress Notes (Signed)
Roger Gonzalez - 54 y.o. male MRN [id number]  Date of birth: April 07, 1965  Subjective Chief Complaint  Patient presents with  . Results  . Hypertension    HPI  Roger Gonzalez is a 54 y.o. male here today for recent ER follow up.  He was seen in ED at [facility] on 7/10 for rash and testicular pain.  Rash located on suprapubic area.  He was initially started on antibiotic however in ED the rash was more concerning for HSV or shingles.  Due to testicular pain as well he had labs for STD testing as well as serology for HSV.  Serology returned + for HSV2.  He had never had an outbreak previously that he is aware of.  Lesions in ED were not swabbed.  He did also have testicular US showing small L hydrocele and small b/l varicoceles.  His testicular pain and rash have improved since starting valtrex.  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  This has improved with flexeril and naproxen. He would like a refill of flexeril.   BP has remained high.  Readings elevated in ED as well. No medication currently.  He denies chest pain, shortness of breath, palpitations, headache or vision changes.   ROS:  A comprehensive ROS was completed and negative except as noted per HPI   Allergies  Allergen Reactions  . Other Other (See Comments)    Steroids cause mania  . Bactrim [Sulfamethoxazole-Trimethoprim] Diarrhea    diarrhea  . Barbiturates Other (See Comments)    hyperactivity  . Penicillins Hives    Has patient had a PCN reaction causing immediate rash, facial/tongue/throat swelling, SOB or lightheadedness with hypotension: Yes Has patient had a PCN reaction causing severe rash involving mucus membranes or skin necrosis: No Has patient had a PCN reaction that required hospitalization No Has patient had a PCN reaction occurring within the last 10 years: Yes If all of the above answers are "NO", then may proceed with Cephalosporin use.    Past Medical History:   Diagnosis Date  . BIPOLAR DISORDER UNSPECIFIED 11/29/2009  . GERD (gastroesophageal reflux disease)   . UNSPECIFIED HYPOGAMMAGLOBULINEMIA 05/30/2009   Qualifier: Diagnosis of  By: Thomos Lemons      Past Surgical History:  Procedure Laterality Date  . TONSILLECTOMY      Social History   Socioeconomic History  . Marital status: Single    Spouse name: Not on file  . Number of children: Not on file  . Years of education: Not on file  . Highest education level: Not on file  Occupational History  . Not on file  Tobacco Use  . Smoking status: Former Games developer  . Smokeless tobacco: Former Neurosurgeon    Quit date: 05/05/2000  Substance and Sexual Activity  . Alcohol use: No    Alcohol/week: 0.0 standard drinks  . Drug use: No  . Sexual activity: Not on file  Other Topics Concern  . Not on file  Social History Narrative  . Not on file   Social Determinants of Health   Financial Resource Strain:   . Difficulty of Paying Living Expenses:   Food Insecurity:   . Worried About Programme researcher, broadcasting/film/video in the Last Year:   . Barista in the Last Year:   Transportation Needs:   . Freight forwarder (Medical):   Marland Kitchen Lack of Transportation (Non-Medical):   Physical Activity:   . Days of Exercise per Week:   .  Minutes of Exercise per Session:   Stress:   . Feeling of Stress :   Social Connections:   . Frequency of Communication with Friends and Family:   . Frequency of Social Gatherings with Friends and Family:   . Attends Religious Services:   . Active Member of Clubs or Organizations:   . Attends Banker Meetings:   Marland Kitchen Marital Status:     Family History  Problem Relation Age of Onset  . Hypertension Mother   . Bipolar disorder Father     Health Maintenance  Topic Date Due  . Hepatitis C Screening  Never done  . COVID-19 Vaccine (1) Never done  . HIV Screening  Never done  . COLONOSCOPY  Never done  . TETANUS/TDAP  03/23/2019  . INFLUENZA VACCINE   08/23/2019     ----------------------------------------------------------------------------------------------------------------------------------------------------------------------------------------------------------------- Physical Exam BP (!) 154/100 (BP Location: Left Arm, Patient Position: Sitting, Cuff Size: Large)   Pulse 91   Temp 98.2 F (36.8 C)   Ht 5' 8.9" (1.75 m)   Wt 202 lb 1.9 oz (91.7 kg)   SpO2 99%   BMI 29.94 kg/m   Physical Exam Constitutional:      Appearance: Normal appearance.  HENT:     Head: Normocephalic and atraumatic.  Eyes:     General: No scleral icterus. Cardiovascular:     Rate and Rhythm: Normal rate and regular rhythm.  Pulmonary:     Effort: Pulmonary effort is normal.     Breath sounds: Normal breath sounds.  Skin:    General: Skin is warm.     Comments: Rash appears to be resolving, a few scabbed over lesions on lower abdomen  Neurological:     General: No focal deficit present.     Mental Status: He is alert.     ------------------------------------------------------------------------------------------------------------------------------------------------------------------------------------------------------------------- Assessment and Plan  Essential hypertension BP elevated on several occasions.  Will start amlodipine 5mg  daily.  Recommend reduced sodium diet and regular exercise.   Return in about 6 weeks (around 09/16/2019) for HTN.   Rash Serology for HSV2 positive but without swabbing lesion, can't definitively tell.  He has had improvement with valtrex, encouraged to complete.   Low back pain Improved with naproxen and flexeril.  Continue for now as needed. Flexeril renewed.   Testicular pain 09/18/2019 without significant findings to explain pain, notably no torsion or mass.   Improved at this time. He will let me know if symptoms return.     Meds ordered this encounter  Medications  . amLODipine (NORVASC) 5 MG tablet     Sig: Take 1 tablet (5 mg total) by mouth daily.    Dispense:  90 tablet    Refill:  3  . cyclobenzaprine (FLEXERIL) 10 MG tablet    Sig: Take 1 tablet (10 mg total) by mouth 3 (three) times daily as needed for up to 10 days for muscle spasms.    Dispense:  30 tablet    Refill:  1    Return in about 6 weeks (around 09/16/2019) for HTN.    This visit occurred during the SARS-CoV-2 public health emergency.  Safety protocols were in place, including screening questions prior to the visit, additional usage of staff PPE, and extensive cleaning of exam room while observing appropriate contact time as indicated for disinfecting solutions.

## 2019-08-05 NOTE — Patient Instructions (Signed)

## 2019-08-25 ENCOUNTER — Telehealth: Payer: Self-pay

## 2019-08-25 ENCOUNTER — Other Ambulatory Visit: Payer: Self-pay | Admitting: Family Medicine

## 2019-08-25 ENCOUNTER — Other Ambulatory Visit: Payer: Self-pay

## 2019-08-25 MED ORDER — VALACYCLOVIR HCL 1 G PO TABS: 1000.0000 mg | ORAL_TABLET | Freq: Every day | ORAL | 0 refills | Status: DC

## 2019-08-25 MED ORDER — VALACYCLOVIR HCL 1 G PO TABS
1000.0000 mg | ORAL_TABLET | Freq: Every day | ORAL | 0 refills | Status: DC
Start: 2019-08-25 — End: 2019-08-25

## 2019-08-25 NOTE — Telephone Encounter (Signed)
Requesting valcyclovir or other medication for HSV2 outbreak. States inflammation is starting again.

## 2019-08-25 NOTE — Telephone Encounter (Signed)
Rx sent 

## 2019-09-07 ENCOUNTER — Telehealth: Payer: Self-pay

## 2019-09-07 ENCOUNTER — Other Ambulatory Visit: Payer: Self-pay | Admitting: Family Medicine

## 2019-09-07 MED ORDER — VALACYCLOVIR HCL 1 G PO TABS: 1000.0000 mg | ORAL_TABLET | Freq: Every day | ORAL | 2 refills | Status: AC

## 2019-09-07 NOTE — Telephone Encounter (Signed)
Pt called requesting med refill for valacyclovir. Per pt, takes 3 tabs daily for suppression of herpes. Rx not listed in active med list. Pls send refill to [facility].

## 2019-09-07 NOTE — Telephone Encounter (Signed)
Rx for valtrex sent to [facility]

## 2019-09-08 NOTE — Telephone Encounter (Signed)
Patient advised of RX and directions

## 2019-09-16 ENCOUNTER — Ambulatory Visit: Payer: BC Managed Care – PPO | Admitting: Family Medicine

## 2020-01-04 ENCOUNTER — Other Ambulatory Visit: Payer: Self-pay

## 2020-01-04 MED ORDER — AMLODIPINE BESYLATE 5 MG PO TABS: 5.0000 mg | ORAL_TABLET | Freq: Every day | ORAL | 0 refills | Status: DC

## 2020-03-02 ENCOUNTER — Other Ambulatory Visit: Payer: Self-pay

## 2020-03-02 MED ORDER — VALACYCLOVIR HCL 1 G PO TABS: 1000.0000 mg | ORAL_TABLET | Freq: Two times a day (BID) | ORAL | 0 refills | Status: DC

## 2020-03-15 DIAGNOSIS — Z13220 Encounter for screening for lipoid disorders: Secondary | ICD-10-CM | POA: Diagnosis not present

## 2020-03-15 DIAGNOSIS — Z0000 Encounter for general adult medical examination without abnormal findings: Secondary | ICD-10-CM | POA: Diagnosis not present

## 2020-03-16 LAB — COMPLETE METABOLIC PANEL WITH GFR
AG Ratio: 2.3 (calc) (ref 1.0–2.5)
ALT: 23 U/L (ref 9–46)
AST: 19 U/L (ref 10–35)
Albumin: 4.3 g/dL (ref 3.6–5.1)
Alkaline phosphatase (APISO): 61 U/L (ref 35–144)
BUN: 17 mg/dL (ref 7–25)
CO2: 28 mmol/L (ref 20–32)
Calcium: 9.4 mg/dL (ref 8.6–10.3)
Chloride: 103 mmol/L (ref 98–110)
Creat: 0.86 mg/dL (ref 0.70–1.33)
GFR, Est African American: 115 mL/min/{1.73_m2} (ref >=60)
GFR, Est Non African American: 99 mL/min/{1.73_m2} (ref >=60)
Globulin: 1.9 g/dL (calc) (ref 1.9–3.7)
Glucose, Bld: 81 mg/dL (ref 65–99)
Potassium: 4.1 mmol/L (ref 3.5–5.3)
Sodium: 139 mmol/L (ref 135–146)
Total Bilirubin: 1 mg/dL (ref 0.2–1.2)
Total Protein: 6.2 g/dL (ref 6.1–8.1)

## 2020-03-16 LAB — CBC
HCT: 48.3 % (ref 38.5–50.0)
Hemoglobin: 16.9 g/dL (ref 13.2–17.1)
MCH: 31.9 pg (ref 27.0–33.0)
MCHC: 35 g/dL (ref 32.0–36.0)
MCV: 91.1 fL (ref 80.0–100.0)
MPV: 12.9 fL — ABNORMAL HIGH (ref 7.5–12.5)
Platelets: 171 10*3/uL (ref 140–400)
RBC: 5.3 10*6/uL (ref 4.20–5.80)
RDW: 12.1 % (ref 11.0–15.0)
WBC: 6.2 10*3/uL (ref 3.8–10.8)

## 2020-03-16 LAB — LIPID PANEL
Cholesterol: 207 mg/dL — ABNORMAL HIGH (ref <200)
HDL: 52 mg/dL (ref >=40)
LDL Cholesterol (Calc): 135 mg/dL (calc) — ABNORMAL HIGH
Non-HDL Cholesterol (Calc): 155 mg/dL (calc) — ABNORMAL HIGH (ref <130)
Total CHOL/HDL Ratio: 4 (calc) (ref <5.0)
Triglycerides: 94 mg/dL (ref <150)

## 2020-03-23 ENCOUNTER — Other Ambulatory Visit: Payer: Self-pay

## 2020-03-23 ENCOUNTER — Encounter: Payer: Self-pay | Admitting: Physician Assistant

## 2020-03-23 ENCOUNTER — Ambulatory Visit: Payer: BC Managed Care – PPO | Admitting: Physician Assistant

## 2020-03-23 VITALS — BP 131/85 | HR 70 | Ht 69.0 in | Wt 185.0 lb

## 2020-03-23 DIAGNOSIS — I10 Essential (primary) hypertension: Secondary | ICD-10-CM | POA: Diagnosis not present

## 2020-03-23 DIAGNOSIS — R079 Chest pain, unspecified: Secondary | ICD-10-CM

## 2020-03-23 DIAGNOSIS — F5221 Male erectile disorder: Secondary | ICD-10-CM

## 2020-03-23 DIAGNOSIS — R040 Epistaxis: Secondary | ICD-10-CM | POA: Diagnosis not present

## 2020-03-23 DIAGNOSIS — F3160 Bipolar disorder, current episode mixed, unspecified: Secondary | ICD-10-CM

## 2020-03-23 MED ORDER — TADALAFIL 5 MG PO TABS: 5.0000 mg | ORAL_TABLET | Freq: Every day | ORAL | 1 refills | Status: DC | PRN

## 2020-03-23 MED ORDER — SILDENAFIL CITRATE 100 MG PO TABS
50.0000 mg | ORAL_TABLET | Freq: Every day | ORAL | 5 refills | Status: DC | PRN
Start: 2020-03-23 — End: 2020-03-23

## 2020-03-23 NOTE — Patient Instructions (Signed)
Nosebleed, Adult A nosebleed is when blood comes out of the nose. Nosebleeds are common and can be caused by many things. They are usually not a sign of a serious medical problem. Follow these instructions at home: When you have a nosebleed:  Sit down.  Tilt your head a little forward.  Follow these steps: 1. Pinch your nose with a clean towel or tissue. 2. Keep pinching your nose for 5 minutes. Do not let go. 3. After 5 minutes, let go of your nose. 4. If there is still bleeding, do these steps again. Keep doing these steps until the bleeding stops.  Do not put tissues or other things in your nose to stop the bleeding.  Avoid lying down or putting your head back.  Use a nose spray decongestant as told by your doctor.   After a nosebleed:  Try not to blow your nose or sniffle for several hours.  Try not to strain, lift, or bend at the waist for several days.  Aspirin and blood-thinning medicines make bleeding more likely. If you take these medicines: ? Ask your doctor if you should stop taking them or if you should change how much you take. ? Do not stop taking the medicine unless your doctor tells you to.  If your nosebleed was caused by dryness, use over-the-counter saline nasal spray or gel and humidifier as told by your doctor. This will keep the inside of your nose moist and allow it to heal. If you need to use one of these products: ? Choose one that is water-soluble. ? Use only as much as you need and use it only as often as needed. ? Do not lie down right away after you use it.  If you get nosebleeds often, talk with your doctor about treatments. These may include: ? Nasal cautery. A chemical swab or electrical device is used to lightly burn tiny blood vessels inside the nose. This helps stop or prevent nosebleeds. ? Nasal packing. A gauze or other material is placed in the nose to keep constant pressure on the bleeding area. Contact a doctor if:  You have a  fever.  You get nosebleeds often.  You are getting nosebleeds more often than usual.  You bruise very easily.  You have something stuck in your nose.  You have bleeding in your mouth.  You vomit or cough up brown material.  You get a nosebleed after you start a new medicine. Get help right away if:  You have a nosebleed after you fall or hurt your head.  Your nosebleed does not go away after 20 minutes.  You feel dizzy or weak.  You have unusual bleeding from other parts of your body.  You have unusual bruising on other parts of your body.  You get sweaty.  You vomit blood. Summary  Nosebleeds are common. They are usually not a sign of a serious medical problem.  When you have a nosebleed, sit down and tilt your head a little forward. Pinch your nose with a clean tissue for 5 minutes.  Use saline spray or saline gel and a humidifier as told by your doctor.  Get help right away if your nosebleed does not go away after 20 minutes. This information is not intended to replace advice given to you by your health care provider. Make sure you discuss any questions you have with your health care provider. Document Revised: 11/06/2018 Document Reviewed: 11/06/2018 Elsevier Patient Education  2021 [employer] Inc.  

## 2020-03-23 NOTE — Progress Notes (Signed)
Subjective:    Patient ID: Roger Gonzalez, male    DOB: 06-29-1965, 55 y.o.   MRN: [id number]  HPI  Patient is a 55 year old male with bipolar, hypertension, ED who presents to the clinic to discuss recent nosebleed.  2 nights ago he woke up with a nosebleed.  He has never had 1 before.  He admits the seasons are changing and it could have been some nasal dryness.  He is concerned it was his Viagra.  The manufacturer change at the pharmacy and he used different pills that night.  Patient is not taking his Norvasc because his blood pressure readings have been good at home.  He denies any palpitations, shortness of breath, headache.  He does admit to chest pains intermittently during the day.  He seems to get really anxious when they are occurring.  He denies any association with exertion.  Usually he starts deep breathing and they resolve.  He denies any lower extremity edema or cough.  He recently had a lipid panel done with no worrisome findings.  He is interested in changing his eating medication altogether.  He has been on Viagra for a while but feels like it is not working like it used to.  Is wondering if there is something he can take daily.  Patient denies any prostate issues.  .. Active Ambulatory Problems    Diagnosis Date Noted  . UNSPECIFIED HYPOGAMMAGLOBULINEMIA 05/30/2009  . Hyperlipidemia with target LDL less than 160 05/05/2012  . Bipolar 1 disorder, mixed (HCC) 05/07/2012  . Tremor 01/30/2015  . Severe obstructive sleep apnea 10/08/2013  . Erectile disorder, acquired, generalized, severe 01/30/2016  . Well adult exam 07/08/2019  . Essential hypertension 08/05/2019  . Rash 08/05/2019  . Low back pain 08/05/2019  . Testicular pain 08/05/2019   Resolved Ambulatory Problems    Diagnosis Date Noted  . Polyuria 04/09/2012  . Lithium use 04/23/2012  . Apnea 08/13/2013  . Lithium toxicity 01/30/2015  . Hypersomnia 10/08/2013   Past Medical History:  Diagnosis Date   . BIPOLAR DISORDER UNSPECIFIED 11/29/2009  . GERD (gastroesophageal reflux disease)       Review of Systems See HPI.     Objective:   Physical Exam Vitals reviewed.  Constitutional:      Appearance: Normal appearance.  HENT:     Head: Normocephalic.     Nose:     Comments: No active bleeding.  Crusted blood in right external nare. Swollen turbinates. Cardiovascular:     Rate and Rhythm: Normal rate and regular rhythm.     Pulses: Normal pulses.     Heart sounds: Normal heart sounds.  Pulmonary:     Effort: Pulmonary effort is normal.     Breath sounds: Normal breath sounds.  Neurological:     General: No focal deficit present.     Mental Status: He is alert and oriented to person, place, and time.  Psychiatric:     Comments: Flight of ideas.            Assessment & Plan:   Marland KitchenMarland KitchenCan was seen today for hypertension.  Diagnoses and all orders for this visit:  Epistaxis  Essential hypertension  Erectile disorder, acquired, generalized, severe -     tadalafil (CIALIS) 5 MG tablet; Take 1 tablet (5 mg total) by mouth daily as needed for erectile dysfunction.  Chest pain, unspecified type  Bipolar 1 disorder, mixed (HCC)  Other orders -     Discontinue: sildenafil (VIAGRA) 100 MG tablet;  Take 0.5-1 tablets (50-100 mg total) by mouth daily as needed for erectile dysfunction.    EKG- NSR at 66. No ST elevation or depression. No arrhthymias.  CP sounds like panic attacks. He has a history of these.   epistaxis is side effect of ED medications. HO on how to control bleed. Could be the manu factor. Also many other reasons. Consider using nasal ayr solution and trying again. He wants to try another medication. Sent cialis. He could also just request previous manufacture or find a pharmacy where they use this manufacture.   BP great today off norvasc.   Follow up with PCP in 2 weeks.   Spent 30 minutes with patient reviewing chart, discussing plan, giving  reassurance.

## 2020-03-24 ENCOUNTER — Other Ambulatory Visit: Payer: Self-pay | Admitting: Family Medicine

## 2020-03-24 NOTE — Addendum Note (Signed)
Addended by: Chalmers Cater on: 03/24/2020 09:09 AM   Modules accepted: Orders

## 2020-03-30 ENCOUNTER — Other Ambulatory Visit: Payer: Self-pay

## 2020-03-30 DIAGNOSIS — F5221 Male erectile disorder: Secondary | ICD-10-CM

## 2020-03-30 MED ORDER — TADALAFIL 5 MG PO TABS: 5.0000 mg | ORAL_TABLET | Freq: Every day | ORAL | 1 refills | Status: DC | PRN

## 2020-03-31 ENCOUNTER — Other Ambulatory Visit: Payer: Self-pay

## 2020-03-31 MED ORDER — SILDENAFIL CITRATE 100 MG PO TABS: 100.0000 mg | ORAL_TABLET | Freq: Every day | ORAL | 0 refills | Status: DC | PRN

## 2020-04-04 ENCOUNTER — Encounter: Payer: Self-pay | Admitting: Family Medicine

## 2020-04-04 ENCOUNTER — Ambulatory Visit (INDEPENDENT_AMBULATORY_CARE_PROVIDER_SITE_OTHER): Payer: BC Managed Care – PPO | Admitting: Family Medicine

## 2020-04-04 ENCOUNTER — Other Ambulatory Visit: Payer: Self-pay

## 2020-04-04 DIAGNOSIS — I10 Essential (primary) hypertension: Secondary | ICD-10-CM | POA: Diagnosis not present

## 2020-04-04 DIAGNOSIS — M5430 Sciatica, unspecified side: Secondary | ICD-10-CM | POA: Insufficient documentation

## 2020-04-04 DIAGNOSIS — M5431 Sciatica, right side: Secondary | ICD-10-CM | POA: Diagnosis not present

## 2020-04-04 DIAGNOSIS — F5221 Male erectile disorder: Secondary | ICD-10-CM | POA: Diagnosis not present

## 2020-04-04 MED ORDER — PREDNISONE 10 MG (21) PO TBPK: ORAL_TABLET | ORAL | 0 refills | Status: DC

## 2020-04-04 MED ORDER — SILDENAFIL CITRATE 100 MG PO TABS: 100.0000 mg | ORAL_TABLET | Freq: Every day | ORAL | 6 refills | Status: DC | PRN

## 2020-04-04 NOTE — Assessment & Plan Note (Signed)
Start prednisone taper He has home stretches that he will continue.

## 2020-04-04 NOTE — Progress Notes (Signed)
Roger Gonzalez - 55 y.o. male MRN [id number]  Date of birth: September 19, 1965  Subjective Chief Complaint  Patient presents with  . Follow-up    HPI Roger Gonzalez is a 55 y.o. male here today for follow up of HTN and ED.   He has made significant changes to his diet to improve his BP.  He is no longer taking amlodipine.  He denies symptoms related to HTN including chest pain, shortness of breath, palpitations, headache or vision changes.   He continues to have problems with ED.  He was taking sildenafil but though this was causing nosebleeds.  He was switched to tadalfil at visit with [name].  He reports that he had increased headache and penile pain with tadalafil.  Nosebleeds resolved with nasal saline.  He has since tried sildenafil again and this is working well for him.  He would like to continue this.    He also reports some pain that radiates down the R leg with some tingling sensation.  No weakness.  He has not tried anything for treatment of symptoms.    ROS:  A comprehensive ROS was completed and negative except as noted per HPI  Allergies  Allergen Reactions  . Other Other (See Comments)    Steroids cause mania  . Bactrim [Sulfamethoxazole-Trimethoprim] Diarrhea    diarrhea  . Barbiturates Other (See Comments)    hyperactivity  . Penicillins Hives    Has patient had a PCN reaction causing immediate rash, facial/tongue/throat swelling, SOB or lightheadedness with hypotension: Yes Has patient had a PCN reaction causing severe rash involving mucus membranes or skin necrosis: No Has patient had a PCN reaction that required hospitalization No Has patient had a PCN reaction occurring within the last 10 years: Yes If all of the above answers are "NO", then may proceed with Cephalosporin use.  . Tadalafil Other (See Comments)    Penile pain    Past Medical History:  Diagnosis Date  . BIPOLAR DISORDER UNSPECIFIED 11/29/2009  . GERD (gastroesophageal reflux disease)    . UNSPECIFIED HYPOGAMMAGLOBULINEMIA 05/30/2009   Qualifier: Diagnosis of  By: Thomos Lemons      Past Surgical History:  Procedure Laterality Date  . TONSILLECTOMY      Social History   Socioeconomic History  . Marital status: Single    Spouse name: Not on file  . Number of children: Not on file  . Years of education: Not on file  . Highest education level: Not on file  Occupational History  . Not on file  Tobacco Use  . Smoking status: Former Games developer  . Smokeless tobacco: Former Neurosurgeon    Quit date: 05/05/2000  Substance and Sexual Activity  . Alcohol use: No    Alcohol/week: 0.0 standard drinks  . Drug use: No  . Sexual activity: Not on file  Other Topics Concern  . Not on file  Social History Narrative  . Not on file   Social Determinants of Health   Financial Resource Strain: Not on file  Food Insecurity: Not on file  Transportation Needs: Not on file  Physical Activity: Not on file  Stress: Not on file  Social Connections: Not on file    Family History  Problem Relation Age of Onset  . Hypertension Mother   . Bipolar disorder Father     Health Maintenance  Topic Date Due  . Hepatitis C Screening  Never done  . HIV Screening  Never done  . COLONOSCOPY (Pts 45-67yrs Insurance  coverage will need to be confirmed)  Never done  . COVID-19 Vaccine (2 - Booster for [employer] series) 05/31/2019  . TETANUS/TDAP  04/04/2021 (Originally 03/23/2019)  . HPV VACCINES  Aged Out  . INFLUENZA VACCINE  Discontinued     ----------------------------------------------------------------------------------------------------------------------------------------------------------------------------------------------------------------- Physical Exam BP 137/72 (BP Location: Left Arm, Patient Position: Sitting, Cuff Size: Normal)   Pulse 80   Temp 98 F (36.7 C)   Wt 184 lb 1.6 oz (83.5 kg)   SpO2 100%   BMI 27.19 kg/m   Physical Exam Constitutional:      Appearance: Normal  appearance.  Cardiovascular:     Rate and Rhythm: Normal rate and regular rhythm.  Pulmonary:     Effort: Pulmonary effort is normal.     Breath sounds: Normal breath sounds.  Musculoskeletal:     Cervical back: Neck supple.  Neurological:     General: No focal deficit present.     Mental Status: He is alert.  Psychiatric:        Mood and Affect: Mood normal.        Behavior: Behavior normal.     ------------------------------------------------------------------------------------------------------------------------------------------------------------------------------------------------------------------- Assessment and Plan  Essential hypertension BP well controlled at this time.  Continue dietary changes  Erectile disorder, acquired, generalized, severe He'll continue sildenafil as needed. This is better tolerated compared to tadalafil.    Sciatica Start prednisone taper He has home stretches that he will continue.    Meds ordered this encounter  Medications  . sildenafil (VIAGRA) 100 MG tablet    Sig: Take 1 tablet (100 mg total) by mouth daily as needed for erectile dysfunction.    Dispense:  20 tablet    Refill:  6  . predniSONE (STERAPRED UNI-PAK 21 TAB) 10 MG (21) TBPK tablet    Sig: Taper as directed on packaging.    Dispense:  21 tablet    Refill:  0    No follow-ups on file.    This visit occurred during the SARS-CoV-2 public health emergency.  Safety protocols were in place, including screening questions prior to the visit, additional usage of staff PPE, and extensive cleaning of exam room while observing appropriate contact time as indicated for disinfecting solutions.

## 2020-04-04 NOTE — Assessment & Plan Note (Signed)
He'll continue sildenafil as needed. This is better tolerated compared to tadalafil.

## 2020-04-04 NOTE — Assessment & Plan Note (Signed)
BP well controlled at this time.  Continue dietary changes

## 2020-04-08 ENCOUNTER — Other Ambulatory Visit: Payer: Self-pay | Admitting: Family Medicine

## 2020-08-25 ENCOUNTER — Ambulatory Visit (INDEPENDENT_AMBULATORY_CARE_PROVIDER_SITE_OTHER): Payer: Managed Care, Other (non HMO) | Admitting: Family Medicine

## 2020-08-25 ENCOUNTER — Other Ambulatory Visit: Payer: Self-pay

## 2020-08-25 ENCOUNTER — Encounter: Payer: Self-pay | Admitting: Family Medicine

## 2020-08-25 VITALS — BP 158/90 | HR 73 | Temp 97.9°F | Ht 69.0 in | Wt 205.0 lb

## 2020-08-25 DIAGNOSIS — Z0000 Encounter for general adult medical examination without abnormal findings: Secondary | ICD-10-CM

## 2020-08-25 DIAGNOSIS — Z125 Encounter for screening for malignant neoplasm of prostate: Secondary | ICD-10-CM

## 2020-08-25 DIAGNOSIS — N529 Male erectile dysfunction, unspecified: Secondary | ICD-10-CM

## 2020-08-25 DIAGNOSIS — I10 Essential (primary) hypertension: Secondary | ICD-10-CM

## 2020-08-25 DIAGNOSIS — Z1211 Encounter for screening for malignant neoplasm of colon: Secondary | ICD-10-CM

## 2020-08-25 MED ORDER — SILDENAFIL CITRATE 100 MG PO TABS
100.0000 mg | ORAL_TABLET | Freq: Every day | ORAL | 6 refills | Status: DC | PRN
Start: 2020-08-25 — End: 2020-08-30

## 2020-08-25 NOTE — Progress Notes (Signed)
Roger Gonzalez - 55 y.o. male MRN [id number]  Date of birth: [birth year]  Subjective Chief Complaint  Patient presents with   Annual Exam    HPI Roger Gonzalez is a 55 y.o. male here today for annual exam.    He has a history of HTN and OSA.  BP elevated today.  This was well controlled at previous visit.  Recently started a new job which as been a little stressful.  He is a non-smoker.  He does consume EtOH occasionally.    He stays pretty active.  He feels like his diet is pretty good.   He is due for updated colon cancer screening.  He would also like testosterone and PSA updated due to ED.   Review of Systems  Constitutional:  Negative for chills, fever, malaise/fatigue and weight loss.  HENT:  Negative for congestion, ear pain and sore throat.   Eyes:  Negative for blurred vision, double vision and pain.  Respiratory:  Negative for cough and shortness of breath.   Cardiovascular:  Negative for chest pain and palpitations.  Gastrointestinal:  Negative for abdominal pain, blood in stool, constipation, heartburn and nausea.  Genitourinary:  Negative for dysuria and urgency.  Musculoskeletal:  Negative for joint pain and myalgias.  Neurological:  Negative for dizziness and headaches.  Endo/Heme/Allergies:  Does not bruise/bleed easily.  Psychiatric/Behavioral:  Negative for depression. The patient is not nervous/anxious and does not have insomnia.    Allergies  Allergen Reactions   Other Other (See Comments)    Steroids cause mania   Bactrim [Sulfamethoxazole-Trimethoprim] Diarrhea    diarrhea   Barbiturates Other (See Comments)    hyperactivity   Penicillins Hives    Has patient had a PCN reaction causing immediate rash, facial/tongue/throat swelling, SOB or lightheadedness with hypotension: Yes Has patient had a PCN reaction causing severe rash involving mucus membranes or skin necrosis: No Has patient had a PCN reaction that required hospitalization No Has  patient had a PCN reaction occurring within the last 10 years: Yes If all of the above answers are "NO", then may proceed with Cephalosporin use.   Tadalafil Other (See Comments)    Penile pain    Past Medical History:  Diagnosis Date   BIPOLAR DISORDER UNSPECIFIED 11/29/2009   GERD (gastroesophageal reflux disease)    UNSPECIFIED HYPOGAMMAGLOBULINEMIA 05/30/2009   Qualifier: Diagnosis of  By: Thomos Lemons      Past Surgical History:  Procedure Laterality Date   TONSILLECTOMY      Social History   Socioeconomic History   Marital status: Single    Spouse name: Not on file   Number of children: Not on file   Years of education: Not on file   Highest education level: Not on file  Occupational History   Not on file  Tobacco Use   Smoking status: Former   Smokeless tobacco: Former    Quit date: 05/05/2000  Substance and Sexual Activity   Alcohol use: No    Alcohol/week: 0.0 standard drinks   Drug use: No   Sexual activity: Not on file  Other Topics Concern   Not on file  Social History Narrative   Not on file   Social Determinants of Health   Financial Resource Strain: Not on file  Food Insecurity: Not on file  Transportation Needs: Not on file  Physical Activity: Not on file  Stress: Not on file  Social Connections: Not on file    Family History  Problem Relation  Age of Onset   Hypertension Mother    Bipolar disorder Father     Health Maintenance  Topic Date Due   HIV Screening  Never done   Hepatitis C Screening  Never done   COLONOSCOPY (Pts 45-48yrs Insurance coverage will need to be confirmed)  Never done   Zoster Vaccines- Shingrix (1 of 2) Never done   COVID-19 Vaccine (2 - Booster for [employer] series) 05/31/2019   TETANUS/TDAP  06/30/2030   Pneumococcal Vaccine 61-50 Years old  Aged Out   HPV VACCINES  Aged Out   INFLUENZA VACCINE  Discontinued      ----------------------------------------------------------------------------------------------------------------------------------------------------------------------------------------------------------------- Physical Exam BP (!) 158/90   Pulse 73   Temp 97.9 F (36.6 C)   Ht 5\' 9"  (1.753 m)   Wt 205 lb (93 kg)   SpO2 98%   BMI 30.27 kg/m   Physical Exam Constitutional:      General: He is not in acute distress. HENT:     Head: Normocephalic and atraumatic.     Right Ear: Tympanic membrane and external ear normal.     Left Ear: Tympanic membrane and external ear normal.  Eyes:     General: No scleral icterus. Neck:     Thyroid: No thyromegaly.  Cardiovascular:     Rate and Rhythm: Normal rate and regular rhythm.     Heart sounds: Normal heart sounds.  Pulmonary:     Effort: Pulmonary effort is normal.     Breath sounds: Normal breath sounds.  Abdominal:     General: Bowel sounds are normal. There is no distension.     Palpations: Abdomen is soft.     Tenderness: There is no abdominal tenderness. There is no guarding.  Musculoskeletal:     Cervical back: Normal range of motion.  Lymphadenopathy:     Cervical: No cervical adenopathy.  Skin:    General: Skin is warm and dry.     Findings: No rash.  Neurological:     Mental Status: He is alert and oriented to person, place, and time.     Cranial Nerves: No cranial nerve deficit.     Motor: No abnormal muscle tone.  Psychiatric:        Mood and Affect: Mood normal.        Behavior: Behavior normal.    ------------------------------------------------------------------------------------------------------------------------------------------------------------------------------------------------------------------- Assessment and Plan  Essential hypertension BP elevated today.  May be related to stress from new job.  He will return in 4 weeks for BP recheck.  If this remains elevated we discussed restarting  medications.    Well adult exam Well adult Orders Placed This Encounter  Procedures   Testosterone   PSA  Screening: PSA, colonoscopy referral Immunization: UTD Anticipatory guidance/Risk factor reduction:  Recommendations per AVS   Meds ordered this encounter  Medications   sildenafil (VIAGRA) 100 MG tablet    Sig: Take 1 tablet (100 mg total) by mouth daily as needed for erectile dysfunction.    Dispense:  30 tablet    Refill:  6    Return in about 4 weeks (around 09/22/2020) for BP check-Nurse visit.    This visit occurred during the SARS-CoV-2 public health emergency.  Safety protocols were in place, including screening questions prior to the visit, additional usage of staff PPE, and extensive cleaning of exam room while observing appropriate contact time as indicated for disinfecting solutions.

## 2020-08-25 NOTE — Assessment & Plan Note (Signed)
Well adult Orders Placed This Encounter  Procedures  . Testosterone  . PSA  Screening: PSA, colonoscopy referral Immunization: UTD Anticipatory guidance/Risk factor reduction:  Recommendations per AVS

## 2020-08-25 NOTE — Assessment & Plan Note (Signed)
BP elevated today.  May be related to stress from new job.  He will return in 4 weeks for BP recheck.  If this remains elevated we discussed restarting medications.

## 2020-08-25 NOTE — Patient Instructions (Signed)
Preventive Care 40-54 Years Old, Male Preventive care refers to lifestyle choices and visits with your health care provider that can promote health and wellness. This includes: A yearly physical exam. This is also called an annual wellness visit. Regular dental and eye exams. Immunizations. Screening for certain conditions. Healthy lifestyle choices, such as: Eating a healthy diet. Getting regular exercise. Not using drugs or products that contain nicotine and tobacco. Limiting alcohol use. What can I expect for my preventive care visit? Physical exam Your health care provider will check your: Height and weight. These may be used to calculate your BMI (body mass index). BMI is a measurement that tells if you are at a healthy weight. Heart rate and blood pressure. Body temperature. Skin for abnormal spots. Counseling Your health care provider may ask you questions about your: Past medical problems. Family's medical history. Alcohol, tobacco, and drug use. Emotional well-being. Home life and relationship well-being. Sexual activity. Diet, exercise, and sleep habits. Work and work environment. Access to firearms. What immunizations do I need?  Vaccines are usually given at various ages, according to a schedule. Your health care provider will recommend vaccines for you based on your age, medicalhistory, and lifestyle or other factors, such as travel or where you work. What tests do I need? Blood tests Lipid and cholesterol levels. These may be checked every 5 years, or more often if you are over 50 years old. Hepatitis C test. Hepatitis B test. Screening Lung cancer screening. You may have this screening every year starting at age 55 if you have a 30-pack-year history of smoking and currently smoke or have quit within the past 15 years. Prostate cancer screening. Recommendations will vary depending on your family history and other risks. Genital exam to check for testicular cancer  or hernias. Colorectal cancer screening. All adults should have this screening starting at age 50 and continuing until age 75. Your health care provider may recommend screening at age 45 if you are at increased risk. You will have tests every 1-10 years, depending on your results and the type of screening test. Diabetes screening. This is done by checking your blood sugar (glucose) after you have not eaten for a while (fasting). You may have this done every 1-3 years. STD (sexually transmitted disease) testing, if you are at risk. Follow these instructions at home: Eating and drinking  Eat a diet that includes fresh fruits and vegetables, whole grains, lean protein, and low-fat dairy products. Take vitamin and mineral supplements as recommended by your health care provider. Do not drink alcohol if your health care provider tells you not to drink. If you drink alcohol: Limit how much you have to 0-2 drinks a day. Be aware of how much alcohol is in your drink. In the [place] one drink equals one 12 oz bottle of beer (355 mL), one 5 oz glass of wine (148 mL), or one 1 oz glass of hard liquor (44 mL).  Lifestyle Take daily care of your teeth and gums. Brush your teeth every morning and night with fluoride toothpaste. Floss one time each day. Stay active. Exercise for at least 30 minutes 5 or more days each week. Do not use any products that contain nicotine or tobacco, such as cigarettes, e-cigarettes, and chewing tobacco. If you need help quitting, ask your health care provider. Do not use drugs. If you are sexually active, practice safe sex. Use a condom or other form of protection to prevent STIs (sexually transmitted infections). If told by   your health care provider, take low-dose aspirin daily starting at age 50. Find healthy ways to cope with stress, such as: Meditation, yoga, or listening to music. Journaling. Talking to a trusted person. Spending time with friends and  family. Safety Always wear your seat belt while driving or riding in a vehicle. Do not drive: If you have been drinking alcohol. Do not ride with someone who has been drinking. When you are tired or distracted. While texting. Wear a helmet and other protective equipment during sports activities. If you have firearms in your house, make sure you follow all gun safety procedures. What's next? Go to your health care provider once a year for an annual wellness visit. Ask your health care provider how often you should have your eyes and teeth checked. Stay up to date on all vaccines. This information is not intended to replace advice given to you by your health care provider. Make sure you discuss any questions you have with your healthcare provider. Document Revised: 10/07/2018 Document Reviewed: 01/02/2018 Elsevier Patient Education  2022 [employer]  

## 2020-08-26 ENCOUNTER — Other Ambulatory Visit: Payer: Self-pay | Admitting: Family Medicine

## 2020-08-26 LAB — TESTOSTERONE: Testosterone: 380 ng/dL (ref 250–827)

## 2020-08-26 LAB — PSA: PSA: 0.59 ng/mL (ref <=4.00)

## 2020-08-30 ENCOUNTER — Other Ambulatory Visit: Payer: Self-pay

## 2020-08-30 MED ORDER — SILDENAFIL CITRATE 100 MG PO TABS: 100.0000 mg | ORAL_TABLET | Freq: Every day | ORAL | 6 refills | Status: DC | PRN

## 2020-09-22 ENCOUNTER — Ambulatory Visit: Payer: Managed Care, Other (non HMO)

## 2020-09-26 ENCOUNTER — Emergency Department (HOSPITAL_COMMUNITY): Payer: Managed Care, Other (non HMO)

## 2020-09-26 ENCOUNTER — Emergency Department (HOSPITAL_COMMUNITY)
Admission: EM | Admit: 2020-09-26 | Discharge: 2020-09-26 | Disposition: A | Discharge status: Home / Self Care | Payer: Managed Care, Other (non HMO) | Attending: Emergency Medicine | Admitting: Emergency Medicine

## 2020-09-26 ENCOUNTER — Encounter (HOSPITAL_COMMUNITY): Payer: Self-pay | Admitting: *Deleted

## 2020-09-26 ENCOUNTER — Other Ambulatory Visit: Payer: Self-pay

## 2020-09-26 DIAGNOSIS — K8020 Calculus of gallbladder without cholecystitis without obstruction: Secondary | ICD-10-CM | POA: Diagnosis not present

## 2020-09-26 DIAGNOSIS — Z87891 Personal history of nicotine dependence: Secondary | ICD-10-CM | POA: Diagnosis not present

## 2020-09-26 DIAGNOSIS — R109 Unspecified abdominal pain: Secondary | ICD-10-CM | POA: Diagnosis present

## 2020-09-26 DIAGNOSIS — I10 Essential (primary) hypertension: Secondary | ICD-10-CM | POA: Insufficient documentation

## 2020-09-26 DIAGNOSIS — R200 Anesthesia of skin: Secondary | ICD-10-CM | POA: Insufficient documentation

## 2020-09-26 HISTORY — DX: Essential (primary) hypertension: I10

## 2020-09-26 LAB — CBC
HCT: 48.7 % (ref 39.0–52.0)
Hemoglobin: 17.1 g/dL — ABNORMAL HIGH (ref 13.0–17.0)
MCH: 32.8 pg (ref 26.0–34.0)
MCHC: 35.1 g/dL (ref 30.0–36.0)
MCV: 93.5 fL (ref 80.0–100.0)
Platelets: 184 10*3/uL (ref 150–400)
RBC: 5.21 MIL/uL (ref 4.22–5.81)
RDW: 11.9 % (ref 11.5–15.5)
WBC: 5.8 10*3/uL (ref 4.0–10.5)
nRBC: 0 % (ref 0.0–0.2)

## 2020-09-26 LAB — COMPREHENSIVE METABOLIC PANEL
ALT: 35 U/L (ref 0–44)
AST: 31 U/L (ref 15–41)
Albumin: 3.8 g/dL (ref 3.5–5.0)
Alkaline Phosphatase: 61 U/L (ref 38–126)
Anion gap: 7 (ref 5–15)
BUN: 18 mg/dL (ref 6–20)
CO2: 25 mmol/L (ref 22–32)
Calcium: 9 mg/dL (ref 8.9–10.3)
Chloride: 106 mmol/L (ref 98–111)
Creatinine, Ser: 0.88 mg/dL (ref 0.61–1.24)
GFR, Estimated: >60 mL/min (ref >60)
Glucose, Bld: 137 mg/dL — ABNORMAL HIGH (ref 70–99)
Potassium: 4 mmol/L (ref 3.5–5.1)
Sodium: 138 mmol/L (ref 135–145)
Total Bilirubin: 0.5 mg/dL (ref 0.3–1.2)
Total Protein: 5.9 g/dL — ABNORMAL LOW (ref 6.5–8.1)

## 2020-09-26 LAB — LIPASE, BLOOD: Lipase: 37 U/L (ref 11–51)

## 2020-09-26 LAB — URINALYSIS, ROUTINE W REFLEX MICROSCOPIC
Bilirubin Urine: NEGATIVE
Glucose, UA: NEGATIVE mg/dL
Hgb urine dipstick: NEGATIVE
Ketones, ur: NEGATIVE mg/dL
Leukocytes,Ua: NEGATIVE
Nitrite: NEGATIVE
Protein, ur: NEGATIVE mg/dL
Specific Gravity, Urine: 1.015 (ref 1.005–1.030)
pH: 5 (ref 5.0–8.0)

## 2020-09-26 MED ORDER — OXYCODONE-ACETAMINOPHEN 5-325 MG PO TABS
1.0000 | ORAL_TABLET | Freq: Once | ORAL | Status: AC
  Administered 2020-09-26: 1 via ORAL
  Filled 2020-09-26: qty 1

## 2020-09-26 MED ORDER — IOHEXOL 350 MG/ML SOLN
80.0000 mL | Freq: Once | INTRAVENOUS | Status: AC | PRN
  Administered 2020-09-26: 80 mL via INTRAVENOUS

## 2020-09-26 MED ORDER — ONDANSETRON HCL 4 MG/2ML IJ SOLN
4.0000 mg | Freq: Once | INTRAMUSCULAR | Status: AC
  Administered 2020-09-26: 4 mg via INTRAVENOUS
  Filled 2020-09-26: qty 2

## 2020-09-26 MED ORDER — MORPHINE SULFATE (PF) 4 MG/ML IV SOLN
4.0000 mg | Freq: Once | INTRAVENOUS | Status: AC
Start: 2020-09-26 — End: 2020-09-26
  Administered 2020-09-26: 4 mg via INTRAVENOUS
  Filled 2020-09-26: qty 1

## 2020-09-26 MED ORDER — HYDROCODONE-ACETAMINOPHEN 5-325 MG PO TABS
1.0000 | ORAL_TABLET | Freq: Four times a day (QID) | ORAL | 0 refills | Status: DC | PRN
Start: 2020-09-26 — End: 2021-02-02

## 2020-09-26 NOTE — ED Triage Notes (Addendum)
Pt woke up this morning with sudden lower back pain radiating to bilateral flanks with nausea. Denies urinary issues

## 2020-09-26 NOTE — Discharge Instructions (Addendum)
You have gallstones which is most likely what caused your pain.  It will be important to avoid any fried foods.  However if you continue to have pain will need to have your gallbladder out.  Use the pain medications as needed.  Also yearly you will need to have your aorta checked.

## 2020-09-26 NOTE — ED Provider Notes (Signed)
[facility] EMERGENCY DEPARTMENT Provider Note   CSN: [id number] Arrival date & time: 09/26/20  0050     History Chief Complaint  Patient presents with   Abdominal Pain   Back Pain    Roger Gonzalez is a 55 y.o. male.  Patient is a 55 year old male with a history of bipolar disease, GERD, hypertension who is presenting today with complaints of back and abdominal pain that started around 11 PM tonight.  Patient reported he did have a loose stool this morning but otherwise had had a normal day.  He felt fine when he laid down on the couch and went to sleep but it woke him up around 11 PM with a severe pain.  The pain is in his mid to upper back and is moving around into his abdomen and now he feels like it is going numb in his chest.  The pain is 7 out of 10, seems to be worse with movement and is a dull deep type of pain.  He denies any urinary symptoms.  He has had nausea without vomiting.  He denies any shortness of breath.  He does take Viagra and last used it on Friday.  He takes Valtrex every day but no other medications.  He reports he cannot take his blood pressure medication because it interacts with the Viagra.  He was supposed to see his doctor this week but he missed the appointment and reports his blood pressure is usually in the 160s that he is aware of.  Denies ever having symptoms like this before.  Pain does not radiate into the legs and he denies any numbness or tingling in his legs or arms.  The history is provided by the patient and the spouse.  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   Qualifier: Diagnosis of  By: Thomos Lemons      Patient Active Problem List   Diagnosis Date Noted   Sciatica 04/04/2020   Essential hypertension  08/05/2019   Rash 08/05/2019   Low back pain 08/05/2019   Testicular pain 08/05/2019   Well adult exam 07/08/2019   Erectile disorder, acquired, generalized, severe 01/30/2016   Tremor 01/30/2015   Severe obstructive sleep apnea 10/08/2013   Bipolar 1 disorder, mixed (HCC) 05/07/2012   Hyperlipidemia with target LDL less than 160 05/05/2012   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 Authorizing Provider  acetaminophen (TYLENOL) 500 MG tablet Take 1,000 mg by mouth every 6 (six) hours as needed for moderate pain or headache.   Yes [provider]  calcium carbonate (TUMS - DOSED IN MG ELEMENTAL CALCIUM) 500 MG chewable tablet Chew 2 tablets by mouth daily as needed for indigestion  or heartburn.   Yes [provider]  ibuprofen (ADVIL) 200 MG tablet Take 400 mg by mouth every 6 (six) hours as needed for headache or moderate pain.   Yes [provider]  loratadine (CLARITIN) 10 MG tablet Take 10 mg by mouth daily.   Yes [provider]  Multiple Vitamins-Minerals (ONE-A-DAY MENS 50+) TABS Take 1 tablet by mouth daily.   Yes [provider]  Omega-3 Fatty Acids (FISH OIL) 1200 MG CPDR Take 2,400 mg by mouth daily.   Yes [provider]  sildenafil (VIAGRA) 100 MG tablet Take 1 tablet (100 mg total) by mouth daily as needed for erectile dysfunction. 08/30/20  Yes Everrett CoombeMatthews, Cody, DO  valACYclovir (VALTREX) 1000 MG tablet TAKE 1 TABLET BY MOUTH TWICE A  DAY Patient taking differently: Take 1,000 mg by mouth 2 (two) times daily. 04/11/20  Yes Everrett CoombeMatthews, Cody, DO    Allergies    Other, Bactrim [sulfamethoxazole-trimethoprim], Barbiturates, Penicillins, and Tadalafil  Review of Systems   Review of Systems  Gastrointestinal:  Positive for abdominal pain.  Musculoskeletal:  Positive for back pain.  All other systems reviewed and are negative.  Physical Exam Updated Vital Signs BP (!) 171/96 (BP Location: Right Arm)   Pulse 64   Temp 98.6 F (37 C) (Oral)   Resp 20   SpO2 98%   Physical Exam Vitals and nursing note reviewed.  Constitutional:      General: He is not in acute distress.    Appearance: He is well-developed.  HENT:     Head: Normocephalic and atraumatic.     Mouth/Throat:     Mouth: Mucous membranes are moist.  Eyes:     Conjunctiva/sclera: Conjunctivae normal.     Pupils: Pupils are equal, round, and reactive to light.  Cardiovascular:     Rate and Rhythm: Normal rate and regular rhythm.     Pulses: Normal pulses.     Heart sounds: No murmur heard. Pulmonary:     Effort: Pulmonary effort is normal. No respiratory distress.     Breath sounds: Normal breath sounds. No wheezing or rales.  Abdominal:     General: There is no distension.     Palpations: Abdomen is soft.     Tenderness: There is abdominal tenderness in the right upper quadrant, epigastric area and left upper quadrant. There is right CVA tenderness and left CVA tenderness. There is no guarding or rebound.  Musculoskeletal:        General: No tenderness. Normal range of motion.     Cervical back: Normal range of motion and neck supple.     Right lower leg: No edema.     Left lower leg: No edema.  Skin:    General: Skin is warm and dry.     Capillary Refill: Capillary refill takes less than 2 seconds.     Findings: No erythema or rash.  Neurological:     Mental Status: He is alert and oriented to person, place, and time. Mental status is at  baseline.     Sensory: No sensory deficit.     Motor: No weakness.  Psychiatric:        Mood and Affect: Mood normal.        Behavior: Behavior normal.    ED Results / Procedures / Treatments   Labs (all labs ordered are listed, but only abnormal results are displayed) Labs Reviewed  COMPREHENSIVE METABOLIC PANEL - Abnormal; Notable for the following components:  Result Value   Glucose, Bld 137 (*)    Total Protein 5.9 (*)    All other components within normal limits  CBC - Abnormal; Notable for the following components:   Hemoglobin 17.1 (*)    All other components within normal limits  LIPASE, BLOOD  URINALYSIS, ROUTINE W REFLEX MICROSCOPIC    EKG EKG Interpretation  Date/Time:  Monday September 26 2020 02:04:23 EDT Ventricular Rate:  63 PR Interval:  190 QRS Duration: 113 QT Interval:  397 QTC Calculation: 407 R Axis:   52 Text Interpretation: Sinus rhythm Borderline intraventricular conduction delay No significant change since last tracing Confirmed by Gwyneth Sprout ([id number]) on 09/26/2020 2:14:17 AM  Radiology CT Angio Chest/Abd/Pel for Dissection W and/or Wo Contrast  Result Date: 09/26/2020 CLINICAL DATA:  Abdominal pain, aortic dissection suspected EXAM: CT ANGIOGRAPHY CHEST, ABDOMEN AND PELVIS TECHNIQUE: Non-contrast CT of the chest was initially obtained. Multidetector CT imaging through the chest, abdomen and pelvis was performed using the standard protocol during bolus administration of intravenous contrast. Multiplanar reconstructed images and MIPs were obtained and reviewed to evaluate the vascular anatomy. CONTRAST:  13mL OMNIPAQUE IOHEXOL 350 MG/ML SOLN COMPARISON:  None. FINDINGS: CTA CHEST FINDINGS Cardiovascular: The thoracic aorta is dilated in its ascending segment measuring 4.0 x 4.0 cm on coronal image # 68 and sagittal image # 92. The descending thoracic aorta is of normal caliber measuring 2.8 cm in diameter beyond the takeoff of the left subclavian  artery and 2.4 cm in diameter at the level of the left atrium. No intramural hematoma or dissection. The arch vasculature demonstrates classic anatomic configuration and is widely patent. No significant coronary artery calcification. Global cardiac size within normal limits. No pericardial effusion. Central pulmonary arteries are of normal caliber. Mediastinum/Nodes: No enlarged mediastinal, hilar, or axillary lymph nodes. Thyroid gland, trachea, and esophagus demonstrate no significant findings. Lungs/Pleura: Lungs are clear. No pleural effusion or pneumothorax. Musculoskeletal: No acute bone abnormality. No lytic or blastic bone lesion. Review of the MIP images confirms the above findings. CTA ABDOMEN AND PELVIS FINDINGS VASCULAR Aorta: Normal caliber aorta without aneurysm, dissection, vasculitis or significant stenosis. Celiac: Patent without evidence of aneurysm, dissection, vasculitis or significant stenosis. SMA: Patent without evidence of aneurysm, dissection, vasculitis or significant stenosis. Renals: 2 right and 4 left renal arteries are identified. No evidence of hemodynamically significant stenosis. Normal vascular morphology. No aneurysm. IMA: Patent without evidence of aneurysm, dissection, vasculitis or significant stenosis. Inflow: Patent without evidence of aneurysm, dissection, vasculitis or significant stenosis. Veins: No obvious venous abnormality within the limitations of this arterial phase study. Review of the MIP images confirms the above findings. NON-VASCULAR Hepatobiliary: Cholelithiasis noted with distention of the gallbladder, however, no pericholecystic inflammatory changes are noted. Liver unremarkable. No intra or extrahepatic biliary ductal dilation. Pancreas: Unremarkable Spleen: Unremarkable Adrenals/Urinary Tract: Adrenal glands are unremarkable. Kidneys are normal, without renal calculi, focal lesion, or hydronephrosis. Bladder is unremarkable. Stomach/Bowel: Mild sigmoid  diverticulosis. The stomach, small bowel, and large bowel are otherwise unremarkable. No evidence of obstruction or focal inflammation. Appendix normal. No free intraperitoneal gas or fluid. Lymphatic: No pathologic adenopathy within the abdomen and pelvis. Reproductive: Marked prostatic enlargement. Other: Tiny fat containing umbilical hernia. The rectum is unremarkable. Musculoskeletal: No acute bone abnormality. No lytic or blastic bone lesion identified. Review of the MIP images confirms the above findings. IMPRESSION: Mild dilation of the ascending thoracic aorta with maximal caliber of 4.0 cm Recommend annual imaging followup by CTA or MRA. This recommendation follows  2010 ACCF/AHA/AATS/ACR/ASA/SCA/SCAI/SIR/STS/SVM Guidelines for the Diagnosis and Management of Patients with Thoracic Aortic Disease. Circulation. 2010; 121: M250-I370. Aortic aneurysm NOS (ICD10-I71.9). Normal caliber of the descending thoracic aorta and abdominal aorta. No evidence of aortic dissection. Cholelithiasis and gallbladder distension without superimposed pericholecystic inflammatory change. Mild sigmoid diverticulosis. Marked prostatic enlargement.  The bladder is not distended. No acute intra-abdominal pathology identified. No definite radiographic explanation for the patient's reported symptoms. Electronically Signed   By: Helyn Numbers M.D.   On: 09/26/2020 04:14    Procedures Procedures   Medications Ordered in ED Medications  oxyCODONE-acetaminophen (PERCOCET/ROXICET) 5-325 MG per tablet 1 tablet (has no administration in time range)  ondansetron (ZOFRAN) injection 4 mg (4 mg Intravenous Given 09/26/20 0144)  morphine 4 MG/ML injection 4 mg (4 mg Intravenous Given 09/26/20 0144)  iohexol (OMNIPAQUE) 350 MG/ML injection 80 mL (80 mLs Intravenous Contrast Given 09/26/20 0354)    ED Course  I have reviewed the triage vital signs and the nursing notes.  Pertinent labs & imaging results that were available during my care  of the patient were reviewed by me and considered in my medical decision making (see chart for details).    MDM Rules/Calculators/A&P                           Patient is a 55 year old male presenting with nonspecific back and abdominal pain today in the setting of severe hypertension.  Concern for possible kidney stone, dissection.  Lower suspicion for UTI, AAA, lung pathology.  EKG is pending.  CBC with hemoglobin of 17 but otherwise normal.  CMP, UA and lipase are pending.  Patient given pain control.    4:32 AM Labs are all wnl.  UA wnl.  Given pt's pain, htn which has improved some with pain meds will get dissection study to confirm no acute pathology.  4:32 AM CTA with minimal thoracic dilation that will need annual eval.  Also showed large gallstone which is most likely the cause of pain.  O/w neg.  Pain improved but still having pain.  Will give a second dose of meds.  BP improving and now 160/97.  Will give gen surgery f/u and discussed diet changes and f/u with PCP.  MDM   Amount and/or Complexity of Data Reviewed Clinical lab tests: ordered and reviewed Tests in the radiology section of CPT: ordered and reviewed Tests in the medicine section of CPT: ordered and reviewed Independent visualization of images, tracings, or specimens: yes     Final Clinical Impression(s) / ED Diagnoses Final diagnoses:  Calculus of gallbladder without cholecystitis without obstruction    Rx / DC Orders ED Discharge Orders     None        Gwyneth Sprout, MD 09/26/20 [phone]

## 2020-09-29 ENCOUNTER — Other Ambulatory Visit: Payer: Self-pay

## 2020-09-29 ENCOUNTER — Telehealth (INDEPENDENT_AMBULATORY_CARE_PROVIDER_SITE_OTHER): Payer: Managed Care, Other (non HMO) | Admitting: Family Medicine

## 2020-09-29 ENCOUNTER — Encounter: Payer: Self-pay | Admitting: Family Medicine

## 2020-09-29 DIAGNOSIS — K8020 Calculus of gallbladder without cholecystitis without obstruction: Secondary | ICD-10-CM | POA: Diagnosis not present

## 2020-09-29 DIAGNOSIS — I10 Essential (primary) hypertension: Secondary | ICD-10-CM | POA: Diagnosis not present

## 2020-09-29 MED ORDER — LOSARTAN POTASSIUM 25 MG PO TABS: 25.0000 mg | ORAL_TABLET | Freq: Every day | ORAL | 1 refills | Status: DC

## 2020-09-29 NOTE — Progress Notes (Signed)
Roger Gonzalez - 55 y.o. male MRN [id number]  Date of birth: Jul 23, 1965   This visit type was conducted due to national recommendations for restrictions regarding the COVID-19 Pandemic (e.g. social distancing).  This format is felt to be most appropriate for this patient at this time.  All issues noted in this document were discussed and addressed.  No physical exam was performed (except for noted visual exam findings with Video Visits).  I discussed the limitations of evaluation and management by telemedicine and the availability of in person appointments. The patient expressed understanding and agreed to proceed.  I connected withNAME@ on 09/29/20 at  2:30 PM EDT by a video enabled telemedicine application and verified that I am speaking with the correct person using two identifiers.  Present at visit: [name], DO Rudi Rummage   Patient Location: Home [address] [place] [address]   Provider location:   [facility]  Chief Complaint  Patient presents with   Hypertension   Cholelithiasis    HPI  Roger Gonzalez is a 55 y.o. male who presents via audio/video conferencing for a telehealth visit today.  He was recently seen in the ER with complaint of back and abdominal pain.  Had CT scan which showed calculus of the gallbladder.  Recommended outpatient surgical consult which he has scheduled.  Blood pressure also noted to be elevated in the ED.  He has been checking readings at home which have remained elevated.  He has been on blood pressure medication in the past however had side effects with amlodipine.  He would like to try something different for management of his hypertension at this point.  He is concerned about medications worsening his ED symptoms.  He denies any symptoms related to hypertension including chest pain, shortness of breath, palpitations, headache or vision changes.  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   Qualifier: Diagnosis of  By: Thomos Lemons      Past Surgical History:  Procedure Laterality Date   TONSILLECTOMY      Family History  Problem Relation Age of Onset   Hypertension Mother    Bipolar disorder Father     Social History   Socioeconomic History   Marital status: Single    Spouse name: Not on file   Number of children: Not on file   Years of education: Not on file   Highest education level: Not on file  Occupational History   Not on file  Tobacco Use   Smoking status: Former   Smokeless tobacco: Former    Quit date: 05/05/2000  Substance and Sexual Activity   Alcohol use: No    Alcohol/week: 0.0 standard drinks   Drug use: No   Sexual activity: Not on file  Other Topics Concern   Not on file  Social History Narrative   Not on file   Social Determinants of Health   Financial Resource Strain: Not on file  Food Insecurity: Not on file  Transportation Needs: Not on file  Physical Activity: Not on file  Stress: Not on file  Social Connections: Not on file  Intimate Partner Violence: Not on file     Current Outpatient Medications:    acetaminophen (TYLENOL) 500 MG tablet, Take 1,000 mg by  mouth every 6 (six) hours as needed for moderate pain or headache., Disp: , Rfl:    calcium carbonate (TUMS - DOSED IN MG ELEMENTAL CALCIUM) 500 MG chewable tablet, Chew 2 tablets by mouth daily as needed for indigestion or heartburn., Disp: , Rfl:    ibuprofen (ADVIL) 200 MG tablet, Take 400 mg by mouth every 6 (six) hours as needed for headache or moderate pain., Disp: , Rfl:    loratadine (CLARITIN) 10 MG tablet, Take 10 mg by mouth daily., Disp: , Rfl:    losartan (COZAAR) 25 MG tablet, Take 1 tablet (25 mg total) by mouth daily., Disp: 90 tablet, Rfl: 1   Multiple  Vitamins-Minerals (ONE-A-DAY MENS 50+) TABS, Take 1 tablet by mouth daily., Disp: , Rfl:    Omega-3 Fatty Acids (FISH OIL) 1200 MG CPDR, Take 2,400 mg by mouth daily., Disp: , Rfl:    sildenafil (VIAGRA) 100 MG tablet, Take 1 tablet (100 mg total) by mouth daily as needed for erectile dysfunction., Disp: 30 tablet, Rfl: 6   valACYclovir (VALTREX) 1000 MG tablet, TAKE 1 TABLET BY MOUTH TWICE A DAY (Patient taking differently: Take 1,000 mg by mouth 2 (two) times daily.), Disp: 180 tablet, Rfl: 1   HYDROcodone-acetaminophen (NORCO/VICODIN) 5-325 MG tablet, Take 1 tablet by mouth every 6 (six) hours as needed for severe pain., Disp: 10 tablet, Rfl: 0  EXAM:  VITALS per patient if applicable: There were no vitals taken for this visit.  GENERAL: alert, oriented, appears well and in no acute distress  HEENT: atraumatic, conjunttiva clear, no obvious abnormalities on inspection of external nose and ears  NECK: normal movements of the head and neck  LUNGS: on inspection no signs of respiratory distress, breathing rate appears normal, no obvious gross SOB, gasping or wheezing  CV: no obvious cyanosis  MS: moves all visible extremities without noticeable abnormality  PSYCH/NEURO: pleasant and cooperative, no obvious depression or anxiety, speech and thought processing grossly intact  ASSESSMENT AND PLAN:  Discussed the following assessment and plan:  Essential hypertension Blood pressure elevated.  Discussed following a low-sodium diet.  We discussed medication options, starting losartan 25 mg daily.  Recheck BMP in 2 weeks.  Follow-up in 4 to 6 weeks for blood pressure.  Cholelithiasis He has appointment with general surgery to discuss cholecystectomy.  Aware of red flags and reasons to return to the ED.     I discussed the assessment and treatment plan with the patient. The patient was provided an opportunity to ask questions and all were answered. The patient agreed with the plan and  demonstrated an understanding of the instructions.   The patient was advised to call back or seek an in-person evaluation if the symptoms worsen or if the condition fails to improve as anticipated.    [name], DO

## 2020-09-29 NOTE — Assessment & Plan Note (Signed)
He has appointment with general surgery to discuss cholecystectomy.  Aware of red flags and reasons to return to the ED.

## 2020-09-29 NOTE — Assessment & Plan Note (Signed)
Blood pressure elevated.  Discussed following a low-sodium diet.  We discussed medication options, starting losartan 25 mg daily.  Recheck BMP in 2 weeks.  Follow-up in 4 to 6 weeks for blood pressure.

## 2020-11-04 ENCOUNTER — Other Ambulatory Visit: Payer: Self-pay | Admitting: Family Medicine

## 2020-12-05 ENCOUNTER — Other Ambulatory Visit: Payer: Self-pay

## 2020-12-05 ENCOUNTER — Ambulatory Visit: Payer: Managed Care, Other (non HMO) | Admitting: Family Medicine

## 2020-12-05 ENCOUNTER — Encounter: Payer: Self-pay | Admitting: Family Medicine

## 2020-12-05 VITALS — BP 145/88 | HR 71 | Temp 98.8°F | Wt 203.0 lb

## 2020-12-05 DIAGNOSIS — L739 Follicular disorder, unspecified: Secondary | ICD-10-CM

## 2020-12-05 DIAGNOSIS — R110 Nausea: Secondary | ICD-10-CM | POA: Diagnosis not present

## 2020-12-05 DIAGNOSIS — R42 Dizziness and giddiness: Secondary | ICD-10-CM

## 2020-12-05 DIAGNOSIS — R431 Parosmia: Secondary | ICD-10-CM | POA: Diagnosis not present

## 2020-12-05 DIAGNOSIS — K219 Gastro-esophageal reflux disease without esophagitis: Secondary | ICD-10-CM | POA: Diagnosis not present

## 2020-12-05 DIAGNOSIS — R7989 Other specified abnormal findings of blood chemistry: Secondary | ICD-10-CM

## 2020-12-05 MED ORDER — ONDANSETRON 4 MG PO TBDP: 4.0000 mg | ORAL_TABLET | Freq: Three times a day (TID) | ORAL | 0 refills | Status: DC | PRN

## 2020-12-05 MED ORDER — MECLIZINE HCL 25 MG PO TABS: 25.0000 mg | ORAL_TABLET | Freq: Three times a day (TID) | ORAL | 0 refills | Status: DC | PRN

## 2020-12-05 MED ORDER — PANTOPRAZOLE SODIUM 40 MG PO TBEC: 40.0000 mg | DELAYED_RELEASE_TABLET | Freq: Every day | ORAL | 3 refills | Status: DC

## 2020-12-05 NOTE — Progress Notes (Signed)
Acute Office Visit  Subjective:    Patient ID: Roger Gonzalez, male    DOB: 06/08/1965, 55 y.o.   MRN: [id number]  Chief Complaint  Patient presents with   Insect Bite    HPI Patient is in today for possible insect bite.   Yesterday when he got of the shower he noticed a painful pump to his right upper leg. Reports it was very sensitive to touch, swollen, red, brownish/black at the center and felt hard underneath. He tried to pop it but nothing came out. He started putting neosporin in. Reports the bump seems to look significantly better and he does not feel as large of a hard area under the skin. Reports he tried a new deodorant yesterday but is unsure if that would be related.   Yesterday evening/today he started to notice some intermittent lightheadedness, dizziness, and heightened sensitivity to smells. Reports if anything he smells is extremely more potent and makes him nauseous. States he vomited once in his mouth today, but no other episodes although the nausea is consistent with any odor. He denies any specific triggers including rapid movements or position changes. He has a mild headache that improved with ibuprofen.    Additionally he states that his acid reflux seems worse the past few weeks. He is taking tums "like candy" and is struggling with all foods - no specific triggers. States he knows to avoid specific foods and habits but nothing is helping. States mild occasional abdominal discomfort but reports he has known gall stones and is planning to get his gallbladder out soon, waiting on financial concerns. No new abdominal concerns.     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   Qualifier: Diagnosis of  By: Thomos Lemons      Past Surgical History:  Procedure Laterality Date   TONSILLECTOMY      Family History   Problem Relation Age of Onset   Hypertension Mother    Bipolar disorder Father     Social History   Socioeconomic History   Marital status: Single    Spouse name: Not on file   Number of children: Not on file   Years of education: Not on file   Highest education level: Not on file  Occupational History   Not on file  Tobacco Use   Smoking status: Former   Smokeless tobacco: Former    Quit date: 05/05/2000  Substance and Sexual Activity   Alcohol use: No    Alcohol/week: 0.0 standard drinks   Drug use: No   Sexual activity: Not on file  Other Topics Concern   Not on file  Social History Narrative   Not on file   Social Determinants of Health   Financial Resource Strain: Not on file  Food Insecurity: Not on file  Transportation Needs: Not on file  Physical Activity: Not on file  Stress: Not on file  Social Connections: Not on file  Intimate Partner Violence: Not on file    Outpatient Medications Prior to Visit  Medication Sig Dispense Refill   acetaminophen (TYLENOL) 500 MG tablet Take 1,000 mg by mouth every 6 (six) hours as needed for moderate pain or headache.     calcium carbonate (TUMS - DOSED IN MG ELEMENTAL CALCIUM) 500 MG chewable tablet Chew 2 tablets by mouth daily as needed for indigestion or heartburn.  HYDROcodone-acetaminophen (NORCO/VICODIN) 5-325 MG tablet Take 1 tablet by mouth every 6 (six) hours as needed for severe pain. 10 tablet 0   ibuprofen (ADVIL) 200 MG tablet Take 400 mg by mouth every 6 (six) hours as needed for headache or moderate pain.     loratadine (CLARITIN) 10 MG tablet Take 10 mg by mouth daily.     losartan (COZAAR) 25 MG tablet Take 1 tablet (25 mg total) by mouth daily. 90 tablet 1   Multiple Vitamins-Minerals (ONE-A-DAY MENS 50+) TABS Take 1 tablet by mouth daily.     Omega-3 Fatty Acids (FISH OIL) 1200 MG CPDR Take 2,400 mg by mouth daily.     sildenafil (VIAGRA) 100 MG tablet Take 1 tablet (100 mg total) by mouth daily as  needed for erectile dysfunction. 30 tablet 6   valACYclovir (VALTREX) 1000 MG tablet Take 1 tablet (1,000 mg total) by mouth 2 (two) times daily. NO REFILLS. NEEDS LABS AND BP CHECK. 60 tablet 0   No facility-administered medications prior to visit.    Allergies  Allergen Reactions   Barbiturates Other (See Comments)    Hyperactivity, depression   Other Other (See Comments)    Steroids cause mania   Penicillins Hives and Rash    Has patient had a PCN reaction causing immediate rash, facial/tongue/throat swelling, SOB or lightheadedness with hypotension: Yes Has patient had a PCN reaction causing severe rash involving mucus membranes or skin necrosis: No Has patient had a PCN reaction that required hospitalization No Has patient had a PCN reaction occurring within the last 10 years: Yes If all of the above answers are "NO", then may proceed with Cephalosporin use. Has patient had a PCN reaction causing immediate rash, facial/tongue/throat swelling, SOB or lightheadedness with hypotension: Yes Has patient had a PCN reaction causing severe rash involving mucus membranes or skin necrosis: No Has patient had a PCN reaction that required hospitalization No Has patient had a PCN reaction occurring within the last 10 years: Yes If all of the above answers are "NO", then may proceed with Cephalosporin use.   Tadalafil Other (See Comments)    Penile pain   Sulfamethoxazole-Trimethoprim Diarrhea    diarrhea    Review of Systems All review of systems negative except what is listed in the HPI     Objective:    Physical Exam Vitals reviewed.  Constitutional:      Appearance: Normal appearance. He is normal weight.  HENT:     Head: Normocephalic and atraumatic.     Comments: -dix hallpike Cardiovascular:     Rate and Rhythm: Normal rate and regular rhythm.     Heart sounds: Normal heart sounds.  Pulmonary:     Effort: Pulmonary effort is normal.     Breath sounds: Normal breath  sounds.  Abdominal:     General: Abdomen is flat. Bowel sounds are normal.     Palpations: Abdomen is soft.  Musculoskeletal:        General: Normal range of motion.     Right lower leg: No edema.     Left lower leg: No edema.  Skin:    General: Skin is warm and dry.     Comments: See picture, no significant induration, erythema, drainage, warmth  Neurological:     General: No focal deficit present.     Mental Status: He is alert and oriented to person, place, and time. Mental status is at baseline.     Cranial Nerves: No cranial nerve deficit.  Sensory: No sensory deficit.     Motor: No weakness.     Coordination: Coordination normal.     Gait: Gait normal.  Psychiatric:        Behavior: Behavior normal.        Thought Content: Thought content normal.        Judgment: Judgment normal.        BP (!) 145/88 (BP Location: Left Arm, Patient Position: Sitting, Cuff Size: Normal)   Pulse 71   Temp 98.8 F (37.1 C) (Oral)   Wt 203 lb 0.6 oz (92.1 kg)   SpO2 98%   BMI 29.98 kg/m  Wt Readings from Last 3 Encounters:  12/05/20 203 lb 0.6 oz (92.1 kg)  08/25/20 205 lb (93 kg)  04/04/20 184 lb 1.6 oz (83.5 kg)    Health Maintenance Due  Topic Date Due   HIV Screening  Never done   Hepatitis C Screening  Never done   COLONOSCOPY (Pts 45-41yrs Insurance coverage will need to be confirmed)  Never done    There are no preventive care reminders to display for this patient.   Lab Results  Component Value Date   TSH 0.53 01/30/2016   Lab Results  Component Value Date   WBC 5.8 09/26/2020   HGB 17.1 (H) 09/26/2020   HCT 48.7 09/26/2020   MCV 93.5 09/26/2020   PLT 184 09/26/2020   Lab Results  Component Value Date   NA 138 09/26/2020   K 4.0 09/26/2020   CO2 25 09/26/2020   GLUCOSE 137 (H) 09/26/2020   BUN 18 09/26/2020   CREATININE 0.88 09/26/2020   BILITOT 0.5 09/26/2020   ALKPHOS 61 09/26/2020   AST 31 09/26/2020   ALT 35 09/26/2020   PROT 5.9 (L)  09/26/2020   ALBUMIN 3.8 09/26/2020   CALCIUM 9.0 09/26/2020   ANIONGAP 7 09/26/2020   Lab Results  Component Value Date   CHOL 207 (H) 03/15/2020   Lab Results  Component Value Date   HDL 52 03/15/2020   Lab Results  Component Value Date   LDLCALC 135 (H) 03/15/2020   Lab Results  Component Value Date   TRIG 94 03/15/2020   Lab Results  Component Value Date   CHOLHDL 4.0 03/15/2020   Lab Results  Component Value Date   HGBA1C 5.0 01/30/2016       Assessment & Plan:    1. Nausea 2. Dizziness Adding Zofran as needed for nausea Adding meclizine - this will make you drowsy, do not take while working, can use at bedtime if lying down in bed makes you dizzy/nauseous Take daily allergy medication (Claritin, Zyrtec, etc) Use Flonase nasal spray  - CBC with Differential/Platelet - ondansetron (ZOFRAN ODT) 4 MG disintegrating tablet; Take 1 tablet (4 mg total) by mouth every 8 (eight) hours as needed for nausea or vomiting.  Dispense: 20 tablet; Refill: 0 - meclizine (ANTIVERT) 25 MG tablet; Take 1 tablet (25 mg total) by mouth 3 (three) times daily as needed for dizziness.  Dispense: 15 tablet; Refill: 0  3. Gastroesophageal reflux disease, unspecified whether esophagitis present Adding daily pantoprazole Continue avoiding triggers Do not eat close to bedtime  - pantoprazole (PROTONIX) 40 MG tablet; Take 1 tablet (40 mg total) by mouth daily.  Dispense: 30 tablet; Refill: 3  4. Sensitive to smells Difficult to determine etiology Continue to monitor Try wearing mask to help diffuse odors somewhat  5. Folliculitis Difficult to distinguish bite vs ingrown hair No signs of spreading  infection Keep area clean Warm compresses 10 minutes 3-4 times per day Topical neosporin, do not pick at the area Monitor for worsening symptoms - increased redness, pain, drainage, swelling, warmth Checking blood count for any signs of significant infection    Follow-up if symptoms  worsen or fail to improve. Patient aware of signs/symptoms requiring further/urgent evaluation.   Lollie Marrow Reola Calkins, DNP, FNP-C

## 2020-12-05 NOTE — Patient Instructions (Signed)
For the area on your leg: Difficult to distinguish bite vs ingrown hair No signs of spreading infection Keep area clean Warm compresses 10 minutes 3-4 times per day Topical neosporin, do not pick at the area Monitor for worsening symptoms - increased redness, pain, drainage, swelling, warmth Checking blood count for any signs of significant infection  For your lightheadedness/dizziness: Adding Zofran as needed for nausea Adding meclizine - this will make you drowsy, do not take while working, can use at bedtime if lying down in bed makes you dizzy/nauseous Take daily allergy medication (Claritin, Zyrtec, etc) Use Flonase nasal spray  For smell sensitivity: Difficult to determine etiology Continue to monitor Try wearing mask to help diffuse odors somewhat  Reflux: Adding daily pantoprazole Continue avoiding triggers Do not eat close to bedtime

## 2020-12-06 LAB — CBC WITH DIFFERENTIAL/PLATELET
Absolute Monocytes: 667 cells/uL (ref 200–950)
Basophils Absolute: 23 cells/uL (ref 0–200)
Basophils Relative: 0.2 %
Eosinophils Absolute: 0 cells/uL — ABNORMAL LOW (ref 15–500)
Eosinophils Relative: 0 %
HCT: 48.1 % (ref 38.5–50.0)
Hemoglobin: 17.2 g/dL — ABNORMAL HIGH (ref 13.2–17.1)
Lymphs Abs: 1369 cells/uL (ref 850–3900)
MCH: 34.3 pg — ABNORMAL HIGH (ref 27.0–33.0)
MCHC: 35.8 g/dL (ref 32.0–36.0)
MCV: 95.8 fL (ref 80.0–100.0)
MPV: 12.8 fL — ABNORMAL HIGH (ref 7.5–12.5)
Monocytes Relative: 5.8 %
Neutro Abs: 9442 cells/uL — ABNORMAL HIGH (ref 1500–7800)
Neutrophils Relative %: 82.1 %
Platelets: 210 10*3/uL (ref 140–400)
RBC: 5.02 10*6/uL (ref 4.20–5.80)
RDW: 12.5 % (ref 11.0–15.0)
Total Lymphocyte: 11.9 %
WBC: 11.5 10*3/uL — ABNORMAL HIGH (ref 3.8–10.8)

## 2020-12-06 LAB — BASIC METABOLIC PANEL
BUN: 20 mg/dL (ref 7–25)
CO2: 29 mmol/L (ref 20–32)
Calcium: 9.8 mg/dL (ref 8.6–10.3)
Chloride: 103 mmol/L (ref 98–110)
Creat: 0.87 mg/dL (ref 0.70–1.30)
Glucose, Bld: 101 mg/dL — ABNORMAL HIGH (ref 65–99)
Potassium: 4.4 mmol/L (ref 3.5–5.3)
Sodium: 139 mmol/L (ref 135–146)

## 2020-12-07 NOTE — Addendum Note (Signed)
Addended by: Hyman Hopes B on: 12/07/2020 09:40 AM   Modules accepted: Orders

## 2020-12-20 ENCOUNTER — Other Ambulatory Visit: Payer: Self-pay | Admitting: Family Medicine

## 2021-01-03 ENCOUNTER — Other Ambulatory Visit: Payer: Self-pay

## 2021-01-03 ENCOUNTER — Ambulatory Visit: Payer: Managed Care, Other (non HMO) | Admitting: Family Medicine

## 2021-01-03 ENCOUNTER — Encounter: Payer: Self-pay | Admitting: Family Medicine

## 2021-01-03 VITALS — BP 144/86 | HR 80 | Temp 98.6°F | Ht 70.0 in | Wt 206.0 lb

## 2021-01-03 DIAGNOSIS — R1032 Left lower quadrant pain: Secondary | ICD-10-CM | POA: Diagnosis not present

## 2021-01-03 DIAGNOSIS — R3911 Hesitancy of micturition: Secondary | ICD-10-CM

## 2021-01-03 MED ORDER — CIPROFLOXACIN HCL 750 MG PO TABS: 750.0000 mg | ORAL_TABLET | Freq: Two times a day (BID) | ORAL | 0 refills | Status: AC

## 2021-01-03 MED ORDER — TAMSULOSIN HCL 0.4 MG PO CAPS: 0.4000 mg | ORAL_CAPSULE | Freq: Every day | ORAL | 3 refills | Status: DC

## 2021-01-03 NOTE — Progress Notes (Signed)
Acute Office Visit  Subjective:    Patient ID: Roger Gonzalez, male    DOB: Apr 24, 1965, 55 y.o.   MRN: [id number]  Chief Complaint  Patient presents with   Mass    HPI Patient is in today for lump in left groin.  Patient states that over the past 3 days he noticed a lump to his left groin. He reports it is thick, hard, about 1 cm in size, initially painful, but now not too uncomfortable. He reports a purplish discoloration. He has not noticed any drainage or surrounding redness or swelling. He tried to pop it this weekend, but states nothing happened.   States he is sexually active with one male and is not concerned about STD. States he was very promiscuous in the past. He does have a history of herpes but states this does not look like his typical flare.     He also mentions some concerns about ED and struggling to find solutions despite trying both cilais and viagra. States he may look into a sex psychiatrist. Recommend he further address this with PCP if needed.      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   Qualifier: Diagnosis of  By: Thomos Lemons      Past Surgical History:  Procedure Laterality Date   TONSILLECTOMY      Family History  Problem Relation Age of Onset   Hypertension Mother    Bipolar disorder Father     Social History   Socioeconomic History   Marital status: Single    Spouse name: Not on file   Number of children: Not on file   Years of education: Not on file   Highest education level: Not on file  Occupational History   Not on file  Tobacco Use   Smoking status: Former   Smokeless tobacco: Former    Quit date: 05/05/2000  Substance and Sexual Activity   Alcohol use: No    Alcohol/week: 0.0 standard drinks   Drug use: No   Sexual activity: Not on file  Other Topics Concern   Not  on file  Social History Narrative   Not on file   Social Determinants of Health   Financial Resource Strain: Not on file  Food Insecurity: Not on file  Transportation Needs: Not on file  Physical Activity: Not on file  Stress: Not on file  Social Connections: Not on file  Intimate Partner Violence: Not on file    Outpatient Medications Prior to Visit  Medication Sig Dispense Refill   acetaminophen (TYLENOL) 500 MG tablet Take 1,000 mg by mouth every 6 (six) hours as needed for moderate pain or headache.     calcium carbonate (TUMS - DOSED IN MG ELEMENTAL CALCIUM) 500 MG chewable tablet Chew 2 tablets by mouth daily as needed for indigestion or heartburn.     HYDROcodone-acetaminophen (NORCO/VICODIN) 5-325 MG tablet Take 1 tablet by mouth every 6 (six) hours as needed for severe pain. 10 tablet 0   ibuprofen (ADVIL) 200 MG tablet Take 400 mg by mouth every 6 (six) hours as needed for headache or moderate pain.     loratadine (CLARITIN) 10 MG tablet Take 10 mg by mouth daily.     losartan (COZAAR) 25 MG tablet Take 1 tablet (25 mg total) by mouth daily. 90 tablet 1   meclizine (ANTIVERT) 25 MG  tablet Take 1 tablet (25 mg total) by mouth 3 (three) times daily as needed for dizziness. 15 tablet 0   Multiple Vitamins-Minerals (ONE-A-DAY MENS 50+) TABS Take 1 tablet by mouth daily.     Omega-3 Fatty Acids (FISH OIL) 1200 MG CPDR Take 2,400 mg by mouth daily.     ondansetron (ZOFRAN ODT) 4 MG disintegrating tablet Take 1 tablet (4 mg total) by mouth every 8 (eight) hours as needed for nausea or vomiting. 20 tablet 0   pantoprazole (PROTONIX) 40 MG tablet Take 1 tablet (40 mg total) by mouth daily. 30 tablet 3   sildenafil (VIAGRA) 100 MG tablet Take 1 tablet (100 mg total) by mouth daily as needed for erectile dysfunction. 30 tablet 6   valACYclovir (VALTREX) 1000 MG tablet TAKE ONE TABLET BY MOUTH TWICE A DAY, SCHEDULE LABS AND FOR BLOOD PRESSURE CHECK WITH PROVIDER 60 tablet 0   VRAYLAR 3 MG  capsule Take 3 mg by mouth daily.     No facility-administered medications prior to visit.    Allergies  Allergen Reactions   Barbiturates Other (See Comments)    Hyperactivity, depression   Other Other (See Comments)    Steroids cause mania   Penicillins Hives and Rash    Has patient had a PCN reaction causing immediate rash, facial/tongue/throat swelling, SOB or lightheadedness with hypotension: Yes Has patient had a PCN reaction causing severe rash involving mucus membranes or skin necrosis: No Has patient had a PCN reaction that required hospitalization No Has patient had a PCN reaction occurring within the last 10 years: Yes If all of the above answers are "NO", then may proceed with Cephalosporin use. Has patient had a PCN reaction causing immediate rash, facial/tongue/throat swelling, SOB or lightheadedness with hypotension: Yes Has patient had a PCN reaction causing severe rash involving mucus membranes or skin necrosis: No Has patient had a PCN reaction that required hospitalization No Has patient had a PCN reaction occurring within the last 10 years: Yes If all of the above answers are "NO", then may proceed with Cephalosporin use.   Tadalafil Other (See Comments)    Penile pain   Sulfamethoxazole-Trimethoprim Diarrhea    diarrhea    Review of Systems All review of systems negative except what is listed in the HPI     Objective:    Physical Exam Vitals reviewed. Exam conducted with a chaperone present.  Constitutional:      Appearance: Normal appearance.  Genitourinary:    Comments: Left inguinal lymphadenopathy, mobile, no erythema, surrounding edema or rashes/discoloration Skin:    General: Skin is warm and dry.  Neurological:     Mental Status: He is alert and oriented to person, place, and time.  Psychiatric:        Mood and Affect: Mood normal.        Behavior: Behavior normal.        Thought Content: Thought content normal.        Judgment: Judgment  normal.    BP (!) 144/86 (BP Location: Left Arm, Patient Position: Sitting, Cuff Size: Normal)    Pulse 80    Temp 98.6 F (37 C) (Oral)    Ht 5\' 10"  (1.778 m)    Wt 206 lb (93.4 kg)    SpO2 98%    BMI 29.56 kg/m  Wt Readings from Last 3 Encounters:  01/03/21 206 lb (93.4 kg)  12/05/20 203 lb 0.6 oz (92.1 kg)  08/25/20 205 lb (93 kg)    Health  Maintenance Due  Topic Date Due   HIV Screening  Never done   Hepatitis C Screening  Never done   COLONOSCOPY (Pts 45-59yrs Insurance coverage will need to be confirmed)  Never done    There are no preventive care reminders to display for this patient.   Lab Results  Component Value Date   TSH 0.53 01/30/2016   Lab Results  Component Value Date   WBC 11.5 (H) 12/05/2020   HGB 17.2 (H) 12/05/2020   HCT 48.1 12/05/2020   MCV 95.8 12/05/2020   PLT 210 12/05/2020   Lab Results  Component Value Date   NA 139 12/05/2020   K 4.4 12/05/2020   CO2 29 12/05/2020   GLUCOSE 101 (H) 12/05/2020   BUN 20 12/05/2020   CREATININE 0.87 12/05/2020   BILITOT 0.5 09/26/2020   ALKPHOS 61 09/26/2020   AST 31 09/26/2020   ALT 35 09/26/2020   PROT 5.9 (L) 09/26/2020   ALBUMIN 3.8 09/26/2020   CALCIUM 9.8 12/05/2020   ANIONGAP 7 09/26/2020   Lab Results  Component Value Date   CHOL 207 (H) 03/15/2020   Lab Results  Component Value Date   HDL 52 03/15/2020   Lab Results  Component Value Date   LDLCALC 135 (H) 03/15/2020   Lab Results  Component Value Date   TRIG 94 03/15/2020   Lab Results  Component Value Date   CHOLHDL 4.0 03/15/2020   Lab Results  Component Value Date   HGBA1C 5.0 01/30/2016       Assessment & Plan:   1. Left groin pain 2. Urinary hesitancy Consider BPH and prostatitis or nonspecific cause for lymphadenopathy. Checking urine and blood work today as listed below. Adding cipro for potential infection/prostatitis concern and  flomax for possible BPH. Encouraged hydration. Will update him with results and  any changes to plan.   - ciprofloxacin (CIPRO) 750 MG tablet; Take 1 tablet (750 mg total) by mouth 2 (two) times daily for 14 days.  Dispense: 28 tablet; Refill: 0 - CBC with Differential/Platelet - Comprehensive metabolic panel - PSA, total and free - POCT URINALYSIS DIP (CLINITEK) - tamsulosin (FLOMAX) 0.4 MG CAPS capsule; Take 1 capsule (0.4 mg total) by mouth daily.  Dispense: 30 capsule; Refill: 3 - Urinalysis, Routine w reflex microscopic - Urine Culture   Follow-up in 1 month to reassess or sooner if symptoms worsen.    Dr. Benjamin Stain assisted with physical exam and plan.   Lollie Marrow Reola Calkins, DNP, FNP-C

## 2021-01-03 NOTE — Patient Instructions (Signed)
Urine sample today Blood work today Start antibiotics  Start Flomax for urinary/prostate symptoms We will update you on results and any changes to plan of care Follow-up in 1 month

## 2021-01-04 LAB — TIQ-MISC

## 2021-01-04 LAB — COMPREHENSIVE METABOLIC PANEL
AG Ratio: 2.3 (calc) (ref 1.0–2.5)
ALT: 37 U/L (ref 9–46)
AST: 23 U/L (ref 10–35)
Albumin: 4.5 g/dL (ref 3.6–5.1)
Alkaline phosphatase (APISO): 60 U/L (ref 35–144)
BUN: 20 mg/dL (ref 7–25)
CO2: 26 mmol/L (ref 20–32)
Calcium: 9.4 mg/dL (ref 8.6–10.3)
Chloride: 104 mmol/L (ref 98–110)
Creat: 0.98 mg/dL (ref 0.70–1.30)
Globulin: 2 g/dL (calc) (ref 1.9–3.7)
Glucose, Bld: 102 mg/dL — ABNORMAL HIGH (ref 65–99)
Potassium: 4.1 mmol/L (ref 3.5–5.3)
Sodium: 139 mmol/L (ref 135–146)
Total Bilirubin: 0.6 mg/dL (ref 0.2–1.2)
Total Protein: 6.5 g/dL (ref 6.1–8.1)

## 2021-01-04 LAB — CBC WITH DIFFERENTIAL/PLATELET
Absolute Monocytes: 540 cells/uL (ref 200–950)
Basophils Absolute: 39 cells/uL (ref 0–200)
Basophils Relative: 0.6 %
Eosinophils Absolute: 78 cells/uL (ref 15–500)
Eosinophils Relative: 1.2 %
HCT: 47.8 % (ref 38.5–50.0)
Hemoglobin: 16.9 g/dL (ref 13.2–17.1)
Lymphs Abs: 1664 cells/uL (ref 850–3900)
MCH: 33.7 pg — ABNORMAL HIGH (ref 27.0–33.0)
MCHC: 35.4 g/dL (ref 32.0–36.0)
MCV: 95.4 fL (ref 80.0–100.0)
MPV: 12.9 fL — ABNORMAL HIGH (ref 7.5–12.5)
Monocytes Relative: 8.3 %
Neutro Abs: 4180 cells/uL (ref 1500–7800)
Neutrophils Relative %: 64.3 %
Platelets: 182 10*3/uL (ref 140–400)
RBC: 5.01 10*6/uL (ref 4.20–5.80)
RDW: 12 % (ref 11.0–15.0)
Total Lymphocyte: 25.6 %
WBC: 6.5 10*3/uL (ref 3.8–10.8)

## 2021-01-04 LAB — PSA, TOTAL AND FREE
PSA, % Free: 40 % (calc) (ref >25)
PSA, Free: 0.2 ng/mL
PSA, Total: 0.5 ng/mL (ref <=4.0)

## 2021-01-05 LAB — URINE CULTURE
MICRO NUMBER:: 12753990
Result:: NO GROWTH
SPECIMEN QUALITY:: ADEQUATE

## 2021-01-10 ENCOUNTER — Telehealth: Payer: Self-pay

## 2021-01-10 NOTE — Telephone Encounter (Signed)
Recommend appt., Virtual is ok.

## 2021-01-10 NOTE — Telephone Encounter (Signed)
Pt lvm stating, " I am no longer producing sperm. Nothings coming out. I was wondering if maybe there is a blockage or something. Also my ED meds are not working."  Please advise.   Front Office: Please contact the patient to schedule a virtual appt with Dr. Ashley Royalty.

## 2021-01-11 ENCOUNTER — Telehealth: Payer: Managed Care, Other (non HMO) | Admitting: Sports Medicine

## 2021-01-11 NOTE — Telephone Encounter (Signed)
Dr Ashley Royalty next available appt is out until the end of January and patient wanted to discuss this sooner, patient has been scheduled for a virtual with Dr T for this afternoon to discuss this issue. AM

## 2021-01-12 ENCOUNTER — Telehealth (INDEPENDENT_AMBULATORY_CARE_PROVIDER_SITE_OTHER): Payer: Managed Care, Other (non HMO) | Admitting: Family Medicine

## 2021-01-12 ENCOUNTER — Encounter: Payer: Self-pay | Admitting: Family Medicine

## 2021-01-12 DIAGNOSIS — N5314 Retrograde ejaculation: Secondary | ICD-10-CM | POA: Diagnosis not present

## 2021-01-12 DIAGNOSIS — N529 Male erectile dysfunction, unspecified: Secondary | ICD-10-CM

## 2021-01-16 DIAGNOSIS — N5314 Retrograde ejaculation: Secondary | ICD-10-CM | POA: Insufficient documentation

## 2021-01-16 NOTE — Assessment & Plan Note (Signed)
Symptoms consistent with retrograde ejaculation likely related to Flomax.  He will hold Flomax for now.  He is also having increased difficulty with erections.  Referral placed to urology.

## 2021-01-16 NOTE — Progress Notes (Signed)
Roger Gonzalez - 55 y.o. male MRN [id number]  Date of birth: 1965/03/07   This visit type was conducted due to national recommendations for restrictions regarding the COVID-19 Pandemic (e.g. social distancing).  This format is felt to be most appropriate for this patient at this time.  All issues noted in this document were discussed and addressed.  No physical exam was performed (except for noted visual exam findings with Video Visits).  I discussed the limitations of evaluation and management by telemedicine and the availability of in person appointments. The patient expressed understanding and agreed to proceed.  I connected withNAME@ on 01/16/21 at  3:10 PM EST by a video enabled telemedicine application and verified that I am speaking with the correct person using two identifiers.  Present at visit: Roger Coombe, DO Rudi Rummage   Patient Location: Home [address] [place] [address]   Provider location:   [facility]  Chief Complaint  Patient presents with   Penis Pain    HPI  Roger Gonzalez is a 55 y.o. male who presents via audio/video conferencing for a telehealth visit today.  Reports problem of having dry ejaculations.  This started recently after he was started on Flomax for possible prostatitis.  He has also had increased difficulty with erections despite use of PDE 5 inhibitor.  He would like to see urology.     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   Qualifier: Diagnosis of  By: Thomos Lemons      Past Surgical History:  Procedure Laterality Date   TONSILLECTOMY      Family History  Problem Relation Age of Onset   Hypertension Mother    Bipolar disorder Father     Social History   Socioeconomic History    Marital status: Single    Spouse name: Not on file   Number of children: Not on file   Years of education: Not on file   Highest education level: Not on file  Occupational History   Not on file  Tobacco Use   Smoking status: Former   Smokeless tobacco: Former    Quit date: 05/05/2000  Substance and Sexual Activity   Alcohol use: No    Alcohol/week: 0.0 standard drinks   Drug use: No   Sexual activity: Not on file  Other Topics Concern   Not on file  Social History Narrative   Not on file   Social Determinants of Health   Financial Resource Strain: Not on file  Food Insecurity: Not on file  Transportation Needs: Not on file  Physical Activity: Not on file  Stress: Not on file  Social Connections: Not on file  Intimate Partner Violence: Not on file     Current Outpatient Medications:    acetaminophen (TYLENOL) 500 MG tablet, Take 1,000 mg by mouth every 6 (six) hours as needed for moderate pain or headache., Disp: , Rfl:    busPIRone (BUSPAR) 7.5 MG tablet, Take 7.5 mg by mouth 2 (two) times daily., Disp: , Rfl:    calcium carbonate (TUMS - DOSED IN MG ELEMENTAL CALCIUM) 500 MG chewable tablet, Chew 2 tablets by mouth daily as needed for indigestion or heartburn., Disp: , Rfl:    ciprofloxacin (CIPRO) 750 MG tablet, Take 1 tablet (750 mg  total) by mouth 2 (two) times daily for 14 days., Disp: 28 tablet, Rfl: 0   HYDROcodone-acetaminophen (NORCO/VICODIN) 5-325 MG tablet, Take 1 tablet by mouth every 6 (six) hours as needed for severe pain., Disp: 10 tablet, Rfl: 0   ibuprofen (ADVIL) 200 MG tablet, Take 400 mg by mouth every 6 (six) hours as needed for headache or moderate pain., Disp: , Rfl:    loratadine (CLARITIN) 10 MG tablet, Take 10 mg by mouth daily., Disp: , Rfl:    losartan (COZAAR) 25 MG tablet, Take 1 tablet (25 mg total) by mouth daily., Disp: 90 tablet, Rfl: 1   meclizine (ANTIVERT) 25 MG tablet, Take 1 tablet (25 mg total) by mouth 3 (three) times daily as needed  for dizziness., Disp: 15 tablet, Rfl: 0   Multiple Vitamins-Minerals (ONE-A-DAY MENS 50+) TABS, Take 1 tablet by mouth daily., Disp: , Rfl:    Omega-3 Fatty Acids (FISH OIL) 1200 MG CPDR, Take 2,400 mg by mouth daily., Disp: , Rfl:    ondansetron (ZOFRAN ODT) 4 MG disintegrating tablet, Take 1 tablet (4 mg total) by mouth every 8 (eight) hours as needed for nausea or vomiting., Disp: 20 tablet, Rfl: 0   pantoprazole (PROTONIX) 40 MG tablet, Take 1 tablet (40 mg total) by mouth daily., Disp: 30 tablet, Rfl: 3   sildenafil (VIAGRA) 100 MG tablet, Take 1 tablet (100 mg total) by mouth daily as needed for erectile dysfunction., Disp: 30 tablet, Rfl: 6   tamsulosin (FLOMAX) 0.4 MG CAPS capsule, Take 1 capsule (0.4 mg total) by mouth daily., Disp: 30 capsule, Rfl: 3   valACYclovir (VALTREX) 1000 MG tablet, TAKE ONE TABLET BY MOUTH TWICE A DAY, SCHEDULE LABS AND FOR BLOOD PRESSURE CHECK WITH PROVIDER, Disp: 60 tablet, Rfl: 0   valACYclovir (VALTREX) 1000 MG tablet, Take 1 tablet by mouth 2 (two) times daily., Disp: , Rfl:    VRAYLAR 3 MG capsule, Take 3 mg by mouth daily., Disp: , Rfl:   EXAM:  VITALS per patient if applicable: There were no vitals taken for this visit.  GENERAL: alert, oriented, appears well and in no acute distress  HEENT: atraumatic, conjunttiva clear, no obvious abnormalities on inspection of external nose and ears  NECK: normal movements of the head and neck  LUNGS: on inspection no signs of respiratory distress, breathing rate appears normal, no obvious gross SOB, gasping or wheezing  CV: no obvious cyanosis  MS: moves all visible extremities without noticeable abnormality  PSYCH/NEURO: pleasant and cooperative, no obvious depression or anxiety, speech and thought processing grossly intact  ASSESSMENT AND PLAN:  Discussed the following assessment and plan:  Retrograde ejaculation Symptoms consistent with retrograde ejaculation likely related to Flomax.  He will hold  Flomax for now.  He is also having increased difficulty with erections.  Referral placed to urology.     I discussed the assessment and treatment plan with the patient. The patient was provided an opportunity to ask questions and all were answered. The patient agreed with the plan and demonstrated an understanding of the instructions.   The patient was advised to call back or seek an in-person evaluation if the symptoms worsen or if the condition fails to improve as anticipated.    Roger Coombe, DO

## 2021-01-21 ENCOUNTER — Other Ambulatory Visit: Payer: Self-pay | Admitting: Family Medicine

## 2021-01-25 ENCOUNTER — Telehealth: Payer: Self-pay

## 2021-01-25 NOTE — Telephone Encounter (Signed)
LVM concerning testosterone testing.  No orders placed. Please advise.

## 2021-01-25 NOTE — Telephone Encounter (Signed)
Discussed referral to urology to address his concerns.  They can check testosterone levels at this appt.

## 2021-02-01 NOTE — Telephone Encounter (Signed)
Left a detailed vm msg for patient regarding provider's note/referral. Direct call back info provided.

## 2021-02-02 ENCOUNTER — Other Ambulatory Visit: Payer: Self-pay

## 2021-02-02 ENCOUNTER — Ambulatory Visit: Payer: Managed Care, Other (non HMO) | Admitting: Family Medicine

## 2021-02-02 DIAGNOSIS — K219 Gastro-esophageal reflux disease without esophagitis: Secondary | ICD-10-CM

## 2021-02-02 DIAGNOSIS — N5314 Retrograde ejaculation: Secondary | ICD-10-CM | POA: Diagnosis not present

## 2021-02-02 DIAGNOSIS — F5221 Male erectile disorder: Secondary | ICD-10-CM

## 2021-02-02 MED ORDER — VALACYCLOVIR HCL 1 G PO TABS: 1000.0000 mg | ORAL_TABLET | Freq: Every day | ORAL | 1 refills | Status: DC

## 2021-02-02 MED ORDER — PANTOPRAZOLE SODIUM 40 MG PO TBEC: 40.0000 mg | DELAYED_RELEASE_TABLET | Freq: Every day | ORAL | 3 refills | Status: DC

## 2021-02-05 ENCOUNTER — Encounter: Payer: Self-pay | Admitting: Family Medicine

## 2021-02-05 DIAGNOSIS — K219 Gastro-esophageal reflux disease without esophagitis: Secondary | ICD-10-CM | POA: Insufficient documentation

## 2021-02-05 NOTE — Assessment & Plan Note (Signed)
Continue Protonix 40 mg twice daily. ?

## 2021-02-05 NOTE — Progress Notes (Signed)
[name] - 56 y.o. male MRN [id number]  Date of birth: 12-18-1965  Subjective No chief complaint on file.   HPI Roger Gonzalez is a 56 year old male here today for follow-up of groin pain and urinary hesitancy.  Previously started on Flomax, however had symptoms of retrograde ejaculation.  He has since discontinued this and symptoms of retrograde ejaculation have improved without return of previous urinary hesitancy or groin pain symptoms.  He does have an appointment with urology coming up.  He is concerned about testosterone levels due to worsening ED and he will talk with him about this at this appointment.  ROS:  A comprehensive ROS was completed and negative except as noted per HPI   Allergies  Allergen Reactions   Barbiturates Other (See Comments)    Hyperactivity, depression   Other Other (See Comments)    Steroids cause mania   Penicillins Hives and Rash    Has patient had a PCN reaction causing immediate rash, facial/tongue/throat swelling, SOB or lightheadedness with hypotension: Yes Has patient had a PCN reaction causing severe rash involving mucus membranes or skin necrosis: No Has patient had a PCN reaction that required hospitalization No Has patient had a PCN reaction occurring within the last 10 years: Yes If all of the above answers are "NO", then may proceed with Cephalosporin use. Has patient had a PCN reaction causing immediate rash, facial/tongue/throat swelling, SOB or lightheadedness with hypotension: Yes Has patient had a PCN reaction causing severe rash involving mucus membranes or skin necrosis: No Has patient had a PCN reaction that required hospitalization No Has patient had a PCN reaction occurring within the last 10 years: Yes If all of the above answers are "NO", then may proceed with Cephalosporin use.   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   Qualifier: Diagnosis of  By: Thomos Lemons      Past Surgical History:  Procedure Laterality Date   TONSILLECTOMY      Social History   Socioeconomic History   Marital status: Single    Spouse name: Not on file   Number of children: Not on file   Years of education: Not on file   Highest education level: Not on file  Occupational History   Not on file  Tobacco Use   Smoking status: Former   Smokeless tobacco: Former    Quit date: 05/05/2000  Substance and Sexual Activity   Alcohol use: No    Alcohol/week: 0.0 standard drinks   Drug use: No   Sexual activity: Not on file  Other Topics Concern   Not on file  Social History Narrative   Not on file   Social Determinants of Health   Financial Resource Strain: Not on file  Food Insecurity: Not on file  Transportation Needs: Not on file  Physical Activity: Not on file  Stress: Not on file  Social Connections: Not on file    Family History  Problem Relation Age of Onset   Hypertension Mother    Bipolar disorder Father     Health Maintenance  Topic Date Due   HIV Screening  Never done   Hepatitis C Screening  Never done   COVID-19 Vaccine (2 - Booster for [employer] series) 05/31/2019  Zoster Vaccines- Shingrix (1 of 2) 03/07/2021 (Originally 12/01/2016)   INFLUENZA VACCINE  04/21/2021 (Originally 08/22/2020)   COLONOSCOPY (Pts 45-13yrs Insurance coverage will need to be confirmed)  01/12/2022 (Originally 12/02/2011)   TETANUS/TDAP  06/30/2030   Pneumococcal Vaccine 19-68 Years old  Aged Out   HPV VACCINES  Aged Out     ----------------------------------------------------------------------------------------------------------------------------------------------------------------------------------------------------------------- Physical Exam BP 137/67 (BP  Location: Left Arm, Patient Position: Sitting, Cuff Size: Normal)    Pulse 78    Ht 5\' 10"  (1.778 m)    Wt 201 lb (91.2 kg)    SpO2 100%    BMI 28.84 kg/m   Physical Exam Cardiovascular:     Rate and Rhythm: Normal rate and regular rhythm.  Pulmonary:     Effort: Pulmonary effort is normal.     Breath sounds: Normal breath sounds.  Musculoskeletal:     Cervical back: Neck supple.  Neurological:     Mental Status: He is alert.  Psychiatric:        Mood and Affect: Mood normal.        Behavior: Behavior normal.    ------------------------------------------------------------------------------------------------------------------------------------------------------------------------------------------------------------------- Assessment and Plan  Erectile disorder, acquired, generalized, severe He has become less responsive to PDE 5 inhibitors.  Has upcoming appoint with urology.  Retrograde ejaculation This is resolved with discontinuation of tamsulosin.  GERD (gastroesophageal reflux disease) Continue Protonix 40 mg twice daily.   Meds ordered this encounter  Medications   pantoprazole (PROTONIX) 40 MG tablet    Sig: Take 1 tablet (40 mg total) by mouth daily.    Dispense:  90 tablet    Refill:  3   valACYclovir (VALTREX) 1000 MG tablet    Sig: Take 1 tablet (1,000 mg total) by mouth daily.    Dispense:  90 tablet    Refill:  1    No follow-ups on file.    This visit occurred during the SARS-CoV-2 public health emergency.  Safety protocols were in place, including screening questions prior to the visit, additional usage of staff PPE, and extensive cleaning of exam room while observing appropriate contact time as indicated for disinfecting solutions.

## 2021-02-05 NOTE — Assessment & Plan Note (Signed)
This is resolved with discontinuation of tamsulosin.

## 2021-02-05 NOTE — Assessment & Plan Note (Signed)
He has become less responsive to PDE 5 inhibitors.  Has upcoming appoint with urology.

## 2021-03-15 ENCOUNTER — Other Ambulatory Visit: Payer: Self-pay | Admitting: Family Medicine

## 2021-03-15 ENCOUNTER — Telehealth: Payer: Self-pay

## 2021-03-15 NOTE — Telephone Encounter (Signed)
Please contact pt to schedule appt for dizziness and nausea.  Thanks

## 2021-03-15 NOTE — Telephone Encounter (Signed)
Patient scheduled.

## 2021-03-22 ENCOUNTER — Ambulatory Visit: Payer: Managed Care, Other (non HMO) | Admitting: Family Medicine

## 2021-03-27 ENCOUNTER — Ambulatory Visit: Payer: Managed Care, Other (non HMO) | Admitting: Family Medicine

## 2021-04-19 ENCOUNTER — Other Ambulatory Visit: Payer: Self-pay

## 2021-04-19 MED ORDER — VALACYCLOVIR HCL 1 G PO TABS: 1000.0000 mg | ORAL_TABLET | Freq: Every day | ORAL | 1 refills | Status: DC

## 2021-06-05 ENCOUNTER — Ambulatory Visit: Payer: Managed Care, Other (non HMO) | Admitting: Physician Assistant

## 2021-06-12 ENCOUNTER — Ambulatory Visit: Payer: Managed Care, Other (non HMO) | Admitting: Physician Assistant

## 2021-08-19 ENCOUNTER — Other Ambulatory Visit: Payer: Self-pay | Admitting: Family Medicine

## 2021-09-11 ENCOUNTER — Telehealth: Payer: Self-pay | Admitting: General Practice

## 2021-09-11 NOTE — Telephone Encounter (Signed)
Transition Care Management Follow-up Telephone Call Date of discharge and from where: 09/08/21 from [facility] How have you been since you were released from the hospital? He is doing better overall but he did have another episode of chest pain over the weekend. He states he has been under a lot of stress. He does have an appointment with psychiatrist today and will call back to schedule an appt with PCP. Any questions or concerns? No  Items Reviewed: Did the pt receive and understand the discharge instructions provided? Yes  Medications obtained and verified? Yes  Other? No  Any new allergies since your discharge? No  Dietary orders reviewed? Yes Do you have support at home? Yes   Home Care and Equipment/Supplies: Were home health services ordered? no  Functional Questionnaire: (I = Independent and D = Dependent) ADLs: I  Bathing/Dressing- I  Meal Prep- I  Eating- I  Maintaining continence- I  Transferring/Ambulation- I  Managing Meds- I  Follow up appointments reviewed:  PCP Hospital f/u appt confirmed? No  Patient will call back to schedule. Specialist Hospital f/u appt confirmed? Yes  Scheduled to see the psychiatrist on 09/11/21. Are transportation arrangements needed? No  If their condition worsens, is the pt aware to call PCP or go to the Emergency Dept.? Yes Was the patient provided with contact information for the PCP's office or ED? Yes Was to pt encouraged to call back with questions or concerns? Yes

## 2021-09-17 ENCOUNTER — Other Ambulatory Visit: Payer: Self-pay | Admitting: Family Medicine

## 2021-10-03 ENCOUNTER — Encounter: Payer: Self-pay | Admitting: Family Medicine

## 2021-10-03 ENCOUNTER — Ambulatory Visit (INDEPENDENT_AMBULATORY_CARE_PROVIDER_SITE_OTHER): Payer: Managed Care, Other (non HMO) | Admitting: Family Medicine

## 2021-10-03 VITALS — BP 131/79 | HR 62 | Ht 70.0 in | Wt 195.0 lb

## 2021-10-03 DIAGNOSIS — Z0000 Encounter for general adult medical examination without abnormal findings: Secondary | ICD-10-CM | POA: Diagnosis not present

## 2021-10-03 DIAGNOSIS — I10 Essential (primary) hypertension: Secondary | ICD-10-CM

## 2021-10-03 DIAGNOSIS — Z13220 Encounter for screening for lipoid disorders: Secondary | ICD-10-CM | POA: Diagnosis not present

## 2021-10-03 NOTE — Patient Instructions (Signed)

## 2021-10-03 NOTE — Assessment & Plan Note (Signed)
Well adult Orders Placed This Encounter  Procedures  . COMPLETE METABOLIC PANEL WITH GFR  . CBC with Differential  . Lipid Panel w/reflex Direct LDL  . PSA  Screenings: Per lab orders Immunizations: Declines recommended immunizations Anticipatory guidance/risk factor reduction: Recommendations per AVS.

## 2021-10-03 NOTE — Progress Notes (Addendum)
Roger Gonzalez - 56 y.o. male MRN [id number]  Date of birth: Jul 14, 1965  Subjective Chief Complaint  Patient presents with   Annual Exam    HPI Roger Gonzalez is a 56 year old male here today for annual exam.  Reports that overall he is doing well at this time.  No new concerns today.  Chronic conditions stable with current medications.  He stays moderately active.  He feels like diet is pretty good at this time.  He is a non-smoker.  Consumes alcohol occasionally.  Declines immunizations today.  Review of Systems  Constitutional:  Negative for chills, fever, malaise/fatigue and weight loss.  HENT:  Negative for congestion, ear pain and sore throat.   Eyes:  Negative for blurred vision, double vision and pain.  Respiratory:  Negative for cough and shortness of breath.   Cardiovascular:  Negative for chest pain and palpitations.  Gastrointestinal:  Negative for abdominal pain, blood in stool, constipation, heartburn and nausea.  Genitourinary:  Negative for dysuria and urgency.  Musculoskeletal:  Negative for joint pain and myalgias.  Neurological:  Negative for dizziness and headaches.  Endo/Heme/Allergies:  Does not bruise/bleed easily.  Psychiatric/Behavioral:  Negative for depression. The patient is not nervous/anxious and does not have insomnia.     Allergies  Allergen Reactions   Barbiturates Other (See Comments)    Hyperactivity, depression   Other Other (See Comments)    Steroids cause mania   Penicillins Hives and Rash    Has patient had a PCN reaction causing immediate rash, facial/tongue/throat swelling, SOB or lightheadedness with hypotension: Yes Has patient had a PCN reaction causing severe rash involving mucus membranes or skin necrosis: No Has patient had a PCN reaction that required hospitalization No Has patient had a PCN reaction occurring within the last 10 years: Yes If all of the above answers are "NO", then may proceed with Cephalosporin  use. Has patient had a PCN reaction causing immediate rash, facial/tongue/throat swelling, SOB or lightheadedness with hypotension: Yes Has patient had a PCN reaction causing severe rash involving mucus membranes or skin necrosis: No Has patient had a PCN reaction that required hospitalization No Has patient had a PCN reaction occurring within the last 10 years: Yes If all of the above answers are "NO", then may proceed with Cephalosporin use.   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   Qualifier: Diagnosis of  By: Thomos Lemons      Past Surgical History:  Procedure Laterality Date   TONSILLECTOMY      Social History   Socioeconomic History   Marital status: Single    Spouse name: Not on file   Number of children: Not on file   Years of education: Not on file   Highest education level: Not on file  Occupational History   Not on file  Tobacco Use   Smoking status: Former   Smokeless tobacco: Former    Quit date: 05/05/2000  Substance and Sexual Activity   Alcohol use: No    Alcohol/week: 0.0 standard drinks of alcohol   Drug use: No   Sexual activity: Not on file  Other Topics Concern   Not on file  Social History Narrative   Not on file   Social Determinants of Health  Financial Resource Strain: Not on file  Food Insecurity: Not on file  Transportation Needs: Not on file  Physical Activity: Not on file  Stress: Not on file  Social Connections: Not on file    Family History  Problem Relation Age of Onset   Hypertension Mother    Bipolar disorder Father     Health Maintenance  Topic Date Due   HIV Screening  Never done   Hepatitis C Screening  Never done   Zoster Vaccines- Shingrix (1 of 2) Never done    COVID-19 Vaccine (2 - Booster for [employer] series) 05/31/2019   INFLUENZA VACCINE  Never done   COLONOSCOPY (Pts 45-8yrs Insurance coverage will need to be confirmed)  01/12/2022 (Originally 12/02/2011)   TETANUS/TDAP  06/30/2030   HPV VACCINES  Aged Out     ----------------------------------------------------------------------------------------------------------------------------------------------------------------------------------------------------------------- Physical Exam BP 131/79   Pulse 62   Ht 5\' 10"  (1.778 m)   Wt 195 lb (88.5 kg)   SpO2 96%   BMI 27.98 kg/m   Physical Exam Constitutional:      General: He is not in acute distress. HENT:     Head: Normocephalic and atraumatic.     Right Ear: Tympanic membrane and external ear normal.     Left Ear: Tympanic membrane and external ear normal.  Eyes:     General: No scleral icterus. Neck:     Thyroid: No thyromegaly.  Cardiovascular:     Rate and Rhythm: Normal rate and regular rhythm.     Heart sounds: Normal heart sounds.  Pulmonary:     Effort: Pulmonary effort is normal.     Breath sounds: Normal breath sounds.  Abdominal:     General: Bowel sounds are normal. There is no distension.     Palpations: Abdomen is soft.     Tenderness: There is no abdominal tenderness. There is no guarding.  Musculoskeletal:     Cervical back: Normal range of motion.  Lymphadenopathy:     Cervical: No cervical adenopathy.  Skin:    General: Skin is warm and dry.     Findings: No rash.  Neurological:     Mental Status: He is alert and oriented to person, place, and time.     Cranial Nerves: No cranial nerve deficit.     Motor: No abnormal muscle tone.  Psychiatric:        Mood and Affect: Mood normal.        Behavior: Behavior normal.      ------------------------------------------------------------------------------------------------------------------------------------------------------------------------------------------------------------------- Assessment and Plan  Well adult exam Well adult Orders Placed This Encounter  Procedures   COMPLETE METABOLIC PANEL WITH GFR   CBC with Differential   Lipid Panel w/reflex Direct LDL   PSA  Screenings: Per lab orders Immunizations: Declines recommended immunizations Anticipatory guidance/risk factor reduction: Recommendations per AVS.   No orders of the defined types were placed in this encounter.   No follow-ups on file.    This visit occurred during the SARS-CoV-2 public health emergency.  Safety protocols were in place, including screening questions prior to the visit, additional usage of staff PPE, and extensive cleaning of exam room while observing appropriate contact time as indicated for disinfecting solutions.

## 2021-10-04 LAB — CBC WITH DIFFERENTIAL/PLATELET
Absolute Monocytes: 460 cells/uL (ref 200–950)
Basophils Absolute: 18 cells/uL (ref 0–200)
Basophils Relative: 0.3 %
Eosinophils Absolute: 18 cells/uL (ref 15–500)
Eosinophils Relative: 0.3 %
HCT: 46.2 % (ref 38.5–50.0)
Hemoglobin: 16.1 g/dL (ref 13.2–17.1)
Lymphs Abs: 1292 cells/uL (ref 850–3900)
MCH: 32.5 pg (ref 27.0–33.0)
MCHC: 34.8 g/dL (ref 32.0–36.0)
MCV: 93.3 fL (ref 80.0–100.0)
MPV: 12.4 fL (ref 7.5–12.5)
Monocytes Relative: 7.8 %
Neutro Abs: 4112 cells/uL (ref 1500–7800)
Neutrophils Relative %: 69.7 %
Platelets: 165 10*3/uL (ref 140–400)
RBC: 4.95 10*6/uL (ref 4.20–5.80)
RDW: 11.9 % (ref 11.0–15.0)
Total Lymphocyte: 21.9 %
WBC: 5.9 10*3/uL (ref 3.8–10.8)

## 2021-10-04 LAB — LIPID PANEL W/REFLEX DIRECT LDL
Cholesterol: 222 mg/dL — ABNORMAL HIGH (ref <200)
HDL: 49 mg/dL (ref >=40)
LDL Cholesterol (Calc): 148 mg/dL (calc) — ABNORMAL HIGH
Non-HDL Cholesterol (Calc): 173 mg/dL (calc) — ABNORMAL HIGH (ref <130)
Total CHOL/HDL Ratio: 4.5 (calc) (ref <5.0)
Triglycerides: 126 mg/dL (ref <150)

## 2021-10-04 LAB — COMPLETE METABOLIC PANEL WITH GFR
AG Ratio: 2.4 (calc) (ref 1.0–2.5)
ALT: 38 U/L (ref 9–46)
AST: 25 U/L (ref 10–35)
Albumin: 4.6 g/dL (ref 3.6–5.1)
Alkaline phosphatase (APISO): 62 U/L (ref 35–144)
BUN: 17 mg/dL (ref 7–25)
CO2: 30 mmol/L (ref 20–32)
Calcium: 9.7 mg/dL (ref 8.6–10.3)
Chloride: 103 mmol/L (ref 98–110)
Creat: 0.82 mg/dL (ref 0.70–1.30)
Globulin: 1.9 g/dL (calc) (ref 1.9–3.7)
Glucose, Bld: 87 mg/dL (ref 65–99)
Potassium: 4.3 mmol/L (ref 3.5–5.3)
Sodium: 139 mmol/L (ref 135–146)
Total Bilirubin: 0.8 mg/dL (ref 0.2–1.2)
Total Protein: 6.5 g/dL (ref 6.1–8.1)
eGFR: 104 mL/min/{1.73_m2} (ref >=60)

## 2021-10-04 LAB — PSA: PSA: 0.52 ng/mL (ref <=4.00)

## 2021-10-11 ENCOUNTER — Telehealth: Payer: Self-pay

## 2021-10-11 NOTE — Telephone Encounter (Signed)
Pt lvm stating continual issue with abdominal discomfort. He has changed his diet. Stool still not completely solid but not quite diarrhea.   Do you have any additional treatments?

## 2021-10-11 NOTE — Telephone Encounter (Signed)
Recommend trying align probiotic.  Follow up if not improving.

## 2021-10-16 NOTE — Telephone Encounter (Signed)
Sent patient recommendation via [place]

## 2021-10-17 ENCOUNTER — Other Ambulatory Visit: Payer: Self-pay | Admitting: Family Medicine

## 2021-10-26 ENCOUNTER — Telehealth: Payer: Self-pay

## 2021-10-26 NOTE — Telephone Encounter (Signed)
Pt lvm stating dry ejaculation. He feels the "euphoria" sensation but nothing comes out. His legs and thighs feel sore.   Pt requesting advice.

## 2021-10-30 ENCOUNTER — Telehealth (INDEPENDENT_AMBULATORY_CARE_PROVIDER_SITE_OTHER): Payer: Managed Care, Other (non HMO) | Admitting: Family Medicine

## 2021-10-30 ENCOUNTER — Encounter: Payer: Self-pay | Admitting: Family Medicine

## 2021-10-30 DIAGNOSIS — N529 Male erectile dysfunction, unspecified: Secondary | ICD-10-CM

## 2021-10-30 DIAGNOSIS — Z7689 Persons encountering health services in other specified circumstances: Secondary | ICD-10-CM | POA: Insufficient documentation

## 2021-10-30 DIAGNOSIS — F5221 Male erectile disorder: Secondary | ICD-10-CM | POA: Diagnosis not present

## 2021-10-30 DIAGNOSIS — Z1211 Encounter for screening for malignant neoplasm of colon: Secondary | ICD-10-CM

## 2021-10-30 NOTE — Progress Notes (Signed)
Roger Gonzalez - 56 y.o. male MRN [id number]  Date of birth: January 06, 1966   This visit type was conducted due to national recommendations for restrictions regarding the COVID-19 Pandemic (e.g. social distancing).  This format is felt to be most appropriate for this patient at this time.  All issues noted in this document were discussed and addressed.  No physical exam was performed (except for noted visual exam findings with Video Visits).  I discussed the limitations of evaluation and management by telemedicine and the availability of in person appointments. The patient expressed understanding and agreed to proceed.  I connected withNAME@ on 10/30/21 at 11:30 AM EDT by a video enabled telemedicine application and verified that I am speaking with the correct person using two identifiers.  Present at visit: [name], DO Rudi Rummage   Patient Location: Home [address] [place] [address]   Provider location:   [facility]  Chief Complaint  Patient presents with   Erectile Dysfunction    HPI  Roger Gonzalez is a 56 y.o. male who presents via audio/video conferencing for a telehealth visit today.  Reports that he continues to have issues with the ED is well as dry ejaculation.  Previously on tamsulosin and this did seem to improve after discontinuing this.  He is using sildenafil almost daily.  Additionally, he denies any new changes in his psychiatric medications.  He would be interested in seeing urology.  He is also due for colon cancer screening.  Would like referral for this.  He does have some leaking around the anal area at times as well.     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   Qualifier: Diagnosis of  By: Thomos Lemons       Past Surgical History:  Procedure Laterality Date   TONSILLECTOMY      Family History  Problem Relation Age of Onset   Hypertension Mother    Bipolar disorder Father     Social History   Socioeconomic History   Marital status: Single    Spouse name: Not on file   Number of children: Not on file   Years of education: Not on file   Highest education level: Not on file  Occupational History   Not on file  Tobacco Use   Smoking status: Former   Smokeless tobacco: Former    Quit date: 05/05/2000  Substance and Sexual Activity   Alcohol use: No    Alcohol/week: 0.0 standard drinks of alcohol   Drug use: No   Sexual activity: Not on file  Other Topics Concern   Not on file  Social History Narrative   Not on file   Social Determinants of Health   Financial Resource Strain: Not on file  Food Insecurity: Not on file  Transportation Needs: Not on file  Physical Activity: Not on file  Stress: Not on file  Social Connections: Not on file  Intimate Partner Violence: Not on file     Current Outpatient Medications:    acetaminophen (TYLENOL) 500 MG tablet, Take 1,000 mg by mouth every 6 (six) hours as needed for moderate pain or headache., Disp: , Rfl:    busPIRone (BUSPAR) 7.5 MG tablet, Take 7.5 mg by mouth 2 (two) times daily., Disp: , Rfl:  calcium carbonate (TUMS - DOSED IN MG ELEMENTAL CALCIUM) 500 MG chewable tablet, Chew 2 tablets by mouth daily as needed for indigestion or heartburn., Disp: , Rfl:    ibuprofen (ADVIL) 200 MG tablet, Take 400 mg by mouth every 6 (six) hours as needed for headache or moderate pain., Disp: , Rfl:    loratadine (CLARITIN) 10 MG tablet, Take 10 mg by mouth daily., Disp: , Rfl:    losartan (COZAAR) 25 MG tablet, TAKE 1 TABLET BY MOUTH EVERY DAY, Disp: 90 tablet, Rfl: 1   meclizine (ANTIVERT) 25 MG tablet, Take 1 tablet (25 mg total) by mouth 3 (three) times daily as needed for dizziness., Disp: 15 tablet, Rfl: 0   Multiple  Vitamins-Minerals (ONE-A-DAY MENS 50+) TABS, Take 1 tablet by mouth daily., Disp: , Rfl:    Omega-3 Fatty Acids (FISH OIL) 1200 MG CPDR, Take 2,400 mg by mouth daily., Disp: , Rfl:    ondansetron (ZOFRAN ODT) 4 MG disintegrating tablet, Take 1 tablet (4 mg total) by mouth every 8 (eight) hours as needed for nausea or vomiting., Disp: 20 tablet, Rfl: 0   pantoprazole (PROTONIX) 40 MG tablet, Take 1 tablet (40 mg total) by mouth daily., Disp: 90 tablet, Rfl: 3   sildenafil (VIAGRA) 100 MG tablet, TAKE 1 TABLET BY MOUTH DAILY AS NEEDED FOR FOR ERECTILE DYSFUNCTION, Disp: 30 tablet, Rfl: 6   valACYclovir (VALTREX) 1000 MG tablet, Take 1 tablet (1,000 mg total) by mouth daily., Disp: 90 tablet, Rfl: 1   VRAYLAR 3 MG capsule, Take 3 mg by mouth daily., Disp: , Rfl:   EXAM:  VITALS per patient if applicable: There were no vitals taken for this visit.  GENERAL: alert, oriented, appears well and in no acute distress  HEENT: atraumatic, conjunttiva clear, no obvious abnormalities on inspection of external nose and ears  NECK: normal movements of the head and neck  LUNGS: on inspection no signs of respiratory distress, breathing rate appears normal, no obvious gross SOB, gasping or wheezing  CV: no obvious cyanosis  MS: moves all visible extremities without noticeable abnormality  PSYCH/NEURO: pleasant and cooperative, no obvious depression or anxiety, speech and thought processing grossly intact  ASSESSMENT AND PLAN:  Discussed the following assessment and plan:  Erectile disorder, acquired, generalized, severe Using sildenafil on a regular basis however this is not quite as effective.  Also having dry ejaculation.  Recommend urology referral.  Colon cancer screening Referral to GI.     I discussed the assessment and treatment plan with the patient. The patient was provided an opportunity to ask questions and all were answered. The patient agreed with the plan and demonstrated an  understanding of the instructions.   The patient was advised to call back or seek an in-person evaluation if the symptoms worsen or if the condition fails to improve as anticipated.    Luetta Nutting, DO

## 2021-10-30 NOTE — Assessment & Plan Note (Signed)
Referral to GI  

## 2021-10-30 NOTE — Assessment & Plan Note (Signed)
Using sildenafil on a regular basis however this is not quite as effective.  Also having dry ejaculation.  Recommend urology referral.

## 2021-12-14 ENCOUNTER — Other Ambulatory Visit: Payer: Self-pay | Admitting: Family Medicine

## 2021-12-21 ENCOUNTER — Ambulatory Visit: Payer: Managed Care, Other (non HMO) | Admitting: Family Medicine

## 2021-12-22 ENCOUNTER — Telehealth (HOSPITAL_BASED_OUTPATIENT_CLINIC_OR_DEPARTMENT_OTHER): Payer: Self-pay | Admitting: Family Medicine

## 2021-12-22 ENCOUNTER — Encounter: Payer: Self-pay | Admitting: Family Medicine

## 2021-12-22 ENCOUNTER — Ambulatory Visit (HOSPITAL_BASED_OUTPATIENT_CLINIC_OR_DEPARTMENT_OTHER)
Admission: RE | Admit: 2021-12-22 | Discharge: 2021-12-22 | Disposition: A | Discharge status: Home / Self Care | Payer: Managed Care, Other (non HMO) | Source: Ambulatory Visit | Attending: Family Medicine | Admitting: Family Medicine

## 2021-12-22 ENCOUNTER — Ambulatory Visit: Payer: Managed Care, Other (non HMO) | Admitting: Family Medicine

## 2021-12-22 VITALS — BP 124/71 | HR 73 | Ht 70.0 in | Wt 200.0 lb

## 2021-12-22 DIAGNOSIS — M79604 Pain in right leg: Secondary | ICD-10-CM | POA: Insufficient documentation

## 2021-12-22 DIAGNOSIS — R040 Epistaxis: Secondary | ICD-10-CM

## 2021-12-22 DIAGNOSIS — R252 Cramp and spasm: Secondary | ICD-10-CM

## 2021-12-22 MED ORDER — MUPIROCIN 2 % EX OINT: 1.0000 | TOPICAL_OINTMENT | Freq: Two times a day (BID) | CUTANEOUS | 0 refills | Status: DC

## 2021-12-22 NOTE — Patient Instructions (Addendum)
Go to [facility] for ultrasound of the leg.  Appt is at 2:00PM We'll be in touch with results.

## 2021-12-24 DIAGNOSIS — R040 Epistaxis: Secondary | ICD-10-CM | POA: Insufficient documentation

## 2021-12-24 DIAGNOSIS — M79604 Pain in right leg: Secondary | ICD-10-CM | POA: Insufficient documentation

## 2021-12-24 NOTE — Progress Notes (Signed)
Roger Gonzalez - 56 y.o. male MRN [id number]  Date of birth: [birth year]  Subjective Chief Complaint  Patient presents with   Medication Refill   Epistaxis   Leg Pain    HPI Roger Gonzalez is a 56 year old male here today with complaint of right-sided lower extremity cramping.  This started a few days ago.  Describes cramping sensation in his lower extremity as well as pain on the inside of his thigh.  He does not recall any injury or overuse.  He has been hydrating well.  No prior history of blood clots.  He has not noted any cold sensation of his feet or toes and denies any color change to his feet.  He has had a couple nosebleeds over the past few days.  Denies any pain or sinus congestion associated with this.  ROS:  A comprehensive ROS was completed and negative except as noted per HPI  Allergies  Allergen Reactions   Barbiturates Other (See Comments)    Hyperactivity, depression   Other Other (See Comments)    Steroids cause mania   Penicillins Hives and Rash    Has patient had a PCN reaction causing immediate rash, facial/tongue/throat swelling, SOB or lightheadedness with hypotension: Yes Has patient had a PCN reaction causing severe rash involving mucus membranes or skin necrosis: No Has patient had a PCN reaction that required hospitalization No Has patient had a PCN reaction occurring within the last 10 years: Yes If all of the above answers are "NO", then may proceed with Cephalosporin use. Has patient had a PCN reaction causing immediate rash, facial/tongue/throat swelling, SOB or lightheadedness with hypotension: Yes Has patient had a PCN reaction causing severe rash involving mucus membranes or skin necrosis: No Has patient had a PCN reaction that required hospitalization No Has patient had a PCN reaction occurring within the last 10 years: Yes If all of the above answers are "NO", then may proceed with Cephalosporin use.   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   Qualifier: Diagnosis of  By: Thomos Lemons      Past Surgical History:  Procedure Laterality Date   TONSILLECTOMY      Social History   Socioeconomic History   Marital status: Single    Spouse name: Not on file   Number of children: Not on file   Years of education: Not on file   Highest education level: Not on file  Occupational History   Not on file  Tobacco Use   Smoking status: Former   Smokeless tobacco: Former    Quit date: 05/05/2000  Substance and Sexual Activity   Alcohol use: No    Alcohol/week: 0.0 standard drinks of alcohol   Drug use: No   Sexual activity: Not on file  Other Topics Concern   Not on file  Social History Narrative   Not on file   Social Determinants of Health   Financial Resource Strain: Not on file  Food Insecurity: Not on file  Transportation Needs: Not on file  Physical Activity: Not on file  Stress: Not on file  Social Connections: Not on file    Family History  Problem Relation Age of Onset   Hypertension Mother    Bipolar disorder Father  Health Maintenance  Topic Date Due   COLONOSCOPY (Pts 45-19yrs Insurance coverage will need to be confirmed)  01/12/2022 (Originally 12/02/2011)   COVID-19 Vaccine (2 - 2023-24 season) 02/22/2022 (Originally 09/22/2021)   Zoster Vaccines- Shingrix (1 of 2) 03/23/2022 (Originally 12/01/2016)   INFLUENZA VACCINE  04/22/2022 (Originally 08/22/2021)   Lung Cancer Screening  12/23/2022 (Originally 09/26/2021)   Hepatitis C Screening  12/23/2022 (Originally 12/01/1984)   HIV Screening  12/23/2022 (Originally 12/01/1981)   DTaP/Tdap/Td (3 - Td or Tdap) 06/30/2030   HPV VACCINES  Aged Out      ----------------------------------------------------------------------------------------------------------------------------------------------------------------------------------------------------------------- Physical Exam BP 124/71 (BP Location: Left Arm, Patient Position: Sitting, Cuff Size: Normal)   Pulse 73   Ht 5\' 10"  (1.778 m)   Wt 200 lb (90.7 kg)   SpO2 95%   BMI 28.70 kg/m   Physical Exam Constitutional:      Appearance: Normal appearance.  HENT:     Head: Normocephalic and atraumatic.  Eyes:     General: No scleral icterus. Cardiovascular:     Rate and Rhythm: Normal rate and regular rhythm.     Comments: Dorsalis pedis and posterior tibialis pulses are 2+ bilaterally Pulmonary:     Effort: Pulmonary effort is normal.     Breath sounds: Normal breath sounds.  Musculoskeletal:     Cervical back: Neck supple.  Neurological:     Mental Status: He is alert.  Psychiatric:        Mood and Affect: Mood normal.        Behavior: Behavior normal.     ------------------------------------------------------------------------------------------------------------------------------------------------------------------------------------------------------------------- Assessment and Plan  Right leg pain He has noted some right leg pain and cramping over the past few days.  Tenderness along the left inner thigh and popliteal space.  Ultrasound of the right lower extremity ordered to rule out DVT.  Additionally I am checking labs today for other causes of cramping.  Recurrent epistaxis Recommend adding nasal saline throughout the day.  Humidifier at home recommended.  Adding topical mupirocin x7 days.   Meds ordered this encounter  Medications   mupirocin ointment (BACTROBAN) 2 %    Sig: Apply 1 Application topically 2 (two) times daily. Apply inside nose x7 days.    Dispense:  22 g    Refill:  0    No follow-ups on file.    This visit occurred during the  SARS-CoV-2 public health emergency.  Safety protocols were in place, including screening questions prior to the visit, additional usage of staff PPE, and extensive cleaning of exam room while observing appropriate contact time as indicated for disinfecting solutions.

## 2021-12-24 NOTE — Assessment & Plan Note (Signed)
Recommend adding nasal saline throughout the day.  Humidifier at home recommended.  Adding topical mupirocin x7 days.

## 2021-12-24 NOTE — Assessment & Plan Note (Signed)
He has noted some right leg pain and cramping over the past few days.  Tenderness along the left inner thigh and popliteal space.  Ultrasound of the right lower extremity ordered to rule out DVT.  Additionally I am checking labs today for other causes of cramping.

## 2021-12-25 ENCOUNTER — Other Ambulatory Visit: Payer: Self-pay

## 2021-12-25 MED ORDER — MUPIROCIN 2 % EX OINT: 1.0000 | TOPICAL_OINTMENT | Freq: Two times a day (BID) | CUTANEOUS | 0 refills | Status: DC

## 2021-12-27 LAB — SPECIMEN COMPROMISED

## 2021-12-27 LAB — COMPLETE METABOLIC PANEL WITH GFR
AG Ratio: 2.2 (calc) (ref 1.0–2.5)
ALT: 24 U/L (ref 9–46)
AST: 17 U/L (ref 10–35)
Albumin: 4.2 g/dL (ref 3.6–5.1)
Alkaline phosphatase (APISO): 58 U/L (ref 35–144)
BUN: 20 mg/dL (ref 7–25)
CO2: 25 mmol/L (ref 20–32)
Calcium: 9.6 mg/dL (ref 8.6–10.3)
Chloride: 102 mmol/L (ref 98–110)
Creat: 0.91 mg/dL (ref 0.70–1.30)
Globulin: 1.9 g/dL (calc) (ref 1.9–3.7)
Glucose, Bld: 74 mg/dL (ref 65–99)
Potassium: 4.5 mmol/L (ref 3.5–5.3)
Sodium: 139 mmol/L (ref 135–146)
Total Bilirubin: 0.6 mg/dL (ref 0.2–1.2)
Total Protein: 6.1 g/dL (ref 6.1–8.1)
eGFR: 100 mL/min/{1.73_m2} (ref >=60)

## 2021-12-27 LAB — CBC WITH DIFFERENTIAL/PLATELET
Absolute Monocytes: 422 cells/uL (ref 200–950)
Basophils Absolute: 31 cells/uL (ref 0–200)
Basophils Relative: 0.5 %
Eosinophils Absolute: 37 cells/uL (ref 15–500)
Eosinophils Relative: 0.6 %
HCT: 45.8 % (ref 38.5–50.0)
Hemoglobin: 15.8 g/dL (ref 13.2–17.1)
Lymphs Abs: 1228 cells/uL (ref 850–3900)
MCH: 32.8 pg (ref 27.0–33.0)
MCHC: 34.5 g/dL (ref 32.0–36.0)
MCV: 95 fL (ref 80.0–100.0)
MPV: 12.7 fL — ABNORMAL HIGH (ref 7.5–12.5)
Monocytes Relative: 6.8 %
Neutro Abs: 4483 cells/uL (ref 1500–7800)
Neutrophils Relative %: 72.3 %
Platelets: 159 10*3/uL (ref 140–400)
RBC: 4.82 10*6/uL (ref 4.20–5.80)
RDW: 11.6 % (ref 11.0–15.0)
Total Lymphocyte: 19.8 %
WBC: 6.2 10*3/uL (ref 3.8–10.8)

## 2021-12-27 LAB — MAGNESIUM: Magnesium: 1.9 mg/dL (ref 1.5–2.5)

## 2022-01-16 ENCOUNTER — Ambulatory Visit: Payer: Managed Care, Other (non HMO) | Admitting: Sports Medicine

## 2022-02-25 IMAGING — CT CT ANGIO CHEST-ABD-PELV FOR DISSECTION W/ AND WO/W CM
2 of 7 series · 13 of 46 positions shown, 15 images · IV contrast (OMNI 350)
Comparison: None.

CLINICAL DATA: Abdominal pain, aortic dissection suspected

EXAM:
CT ANGIOGRAPHY CHEST, ABDOMEN AND PELVIS
TECHNIQUE: Non-contrast CT of the chest was initially obtained.

[Series 7: dissection 2mm · axial · 0.97mm/px · z∈[+756,+1394]mm · 10 of 359 slices shown, 12 images]
[im 20/359  soft-tissue]
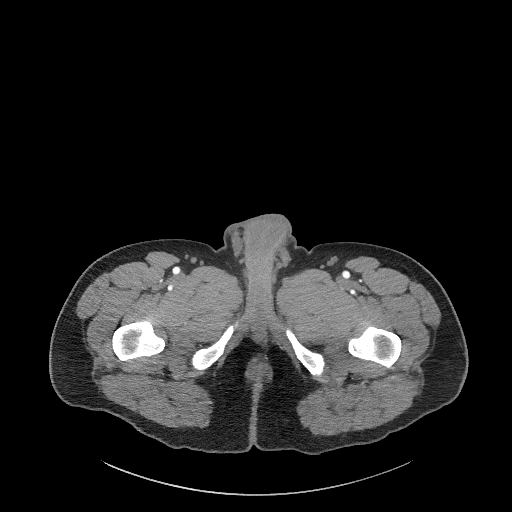
[im 20/359  bone]
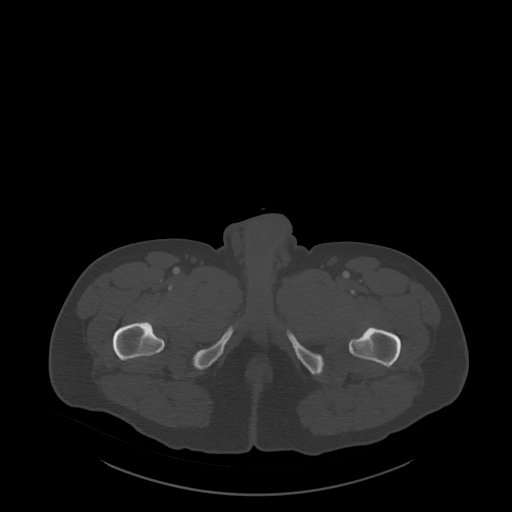
[im 60/359  soft-tissue]
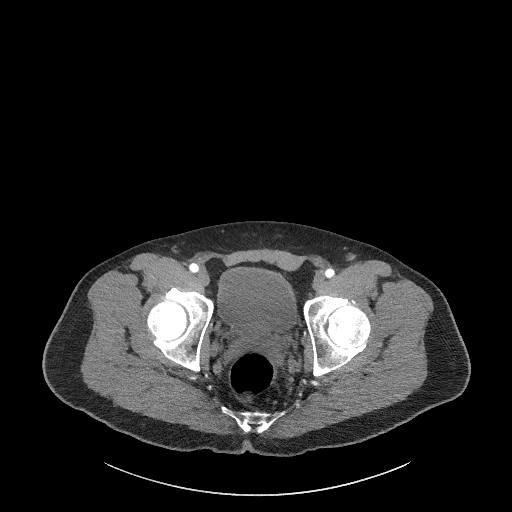
[im 100/359  soft-tissue]
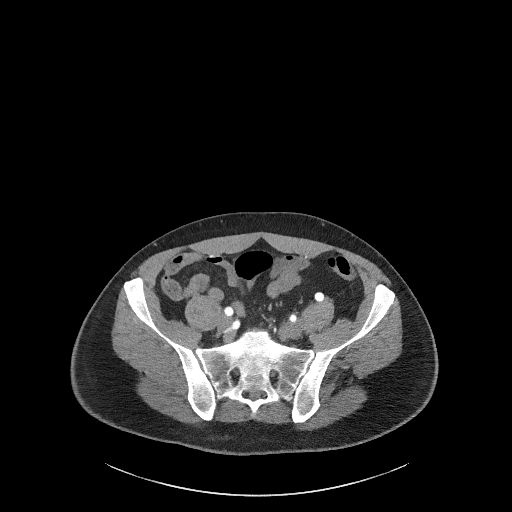
[im 120/359  soft-tissue]
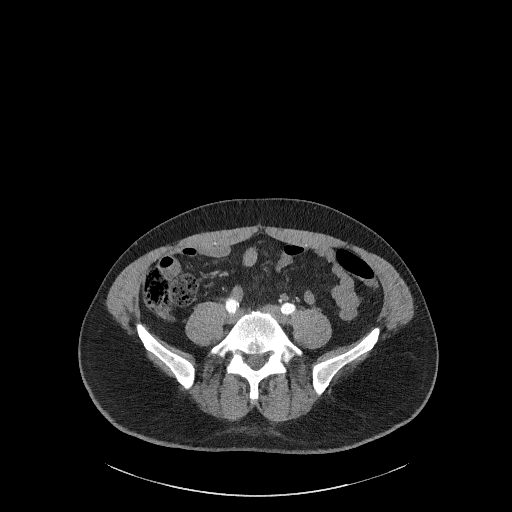
[im 160/359  soft-tissue]
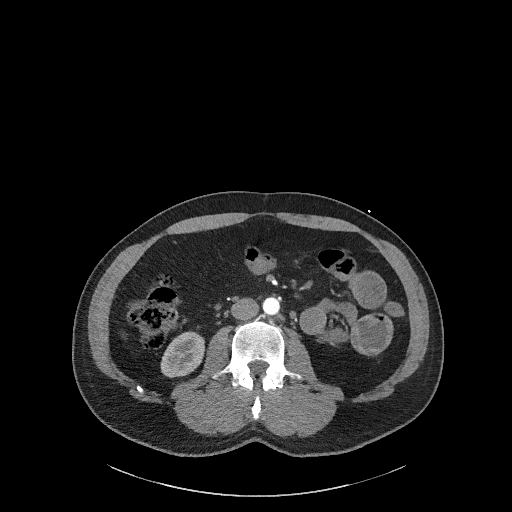
[im 199/359  soft-tissue]
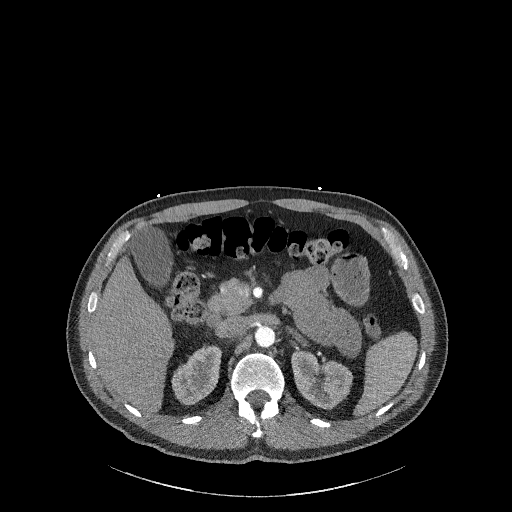
[im 239/359  soft-tissue]
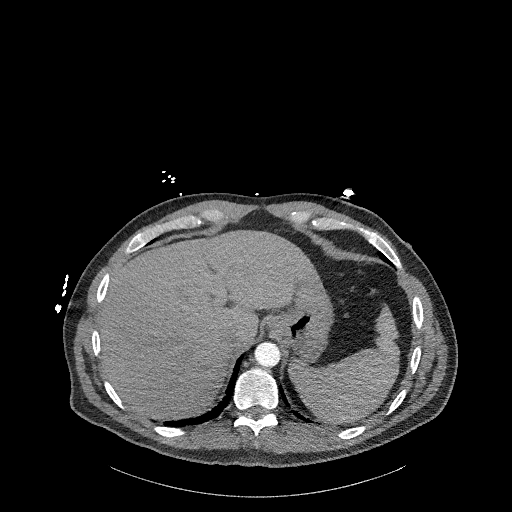
[im 259/359  soft-tissue]
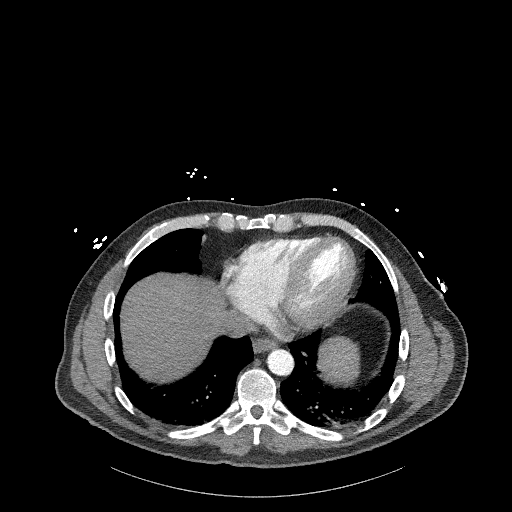
[im 299/359  soft-tissue]
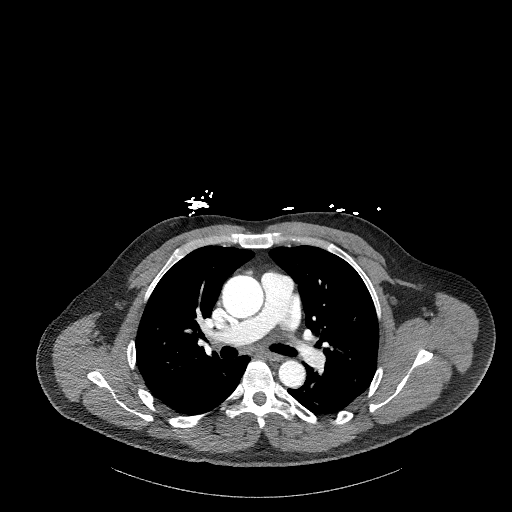
[im 299/359  bone]
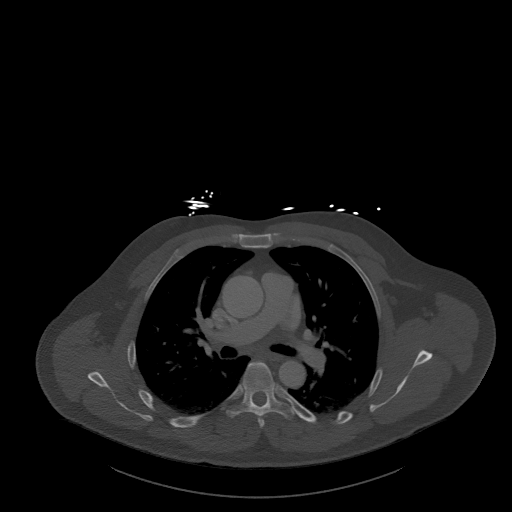
[im 339/359  soft-tissue]
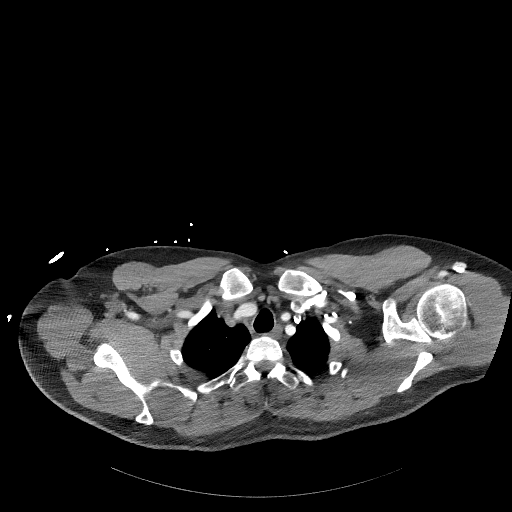

[Series 10: dissection 2mm cor · coronal · 0.91mm/px · 3 of 150 slices shown]
[im 38/150  soft-tissue]
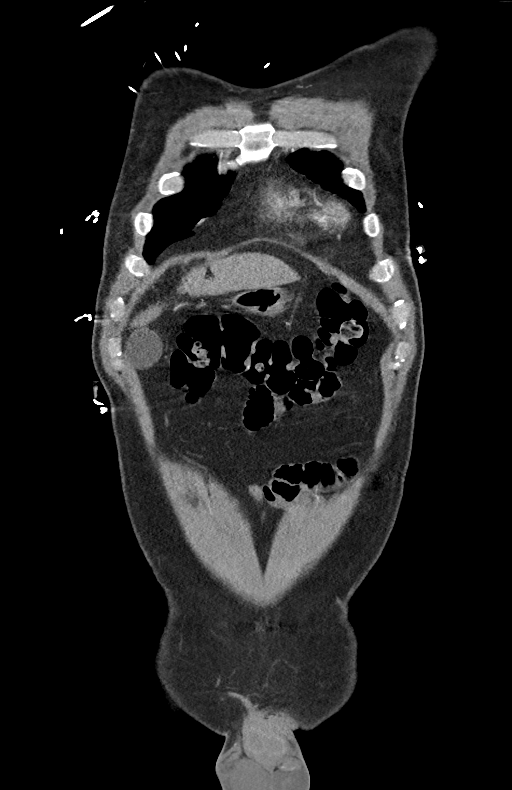
[im 75/150  soft-tissue]
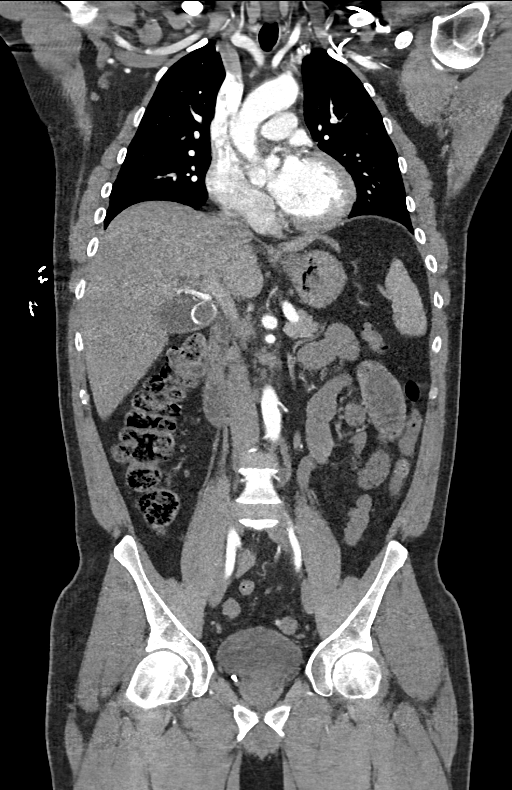
[im 112/150  soft-tissue]
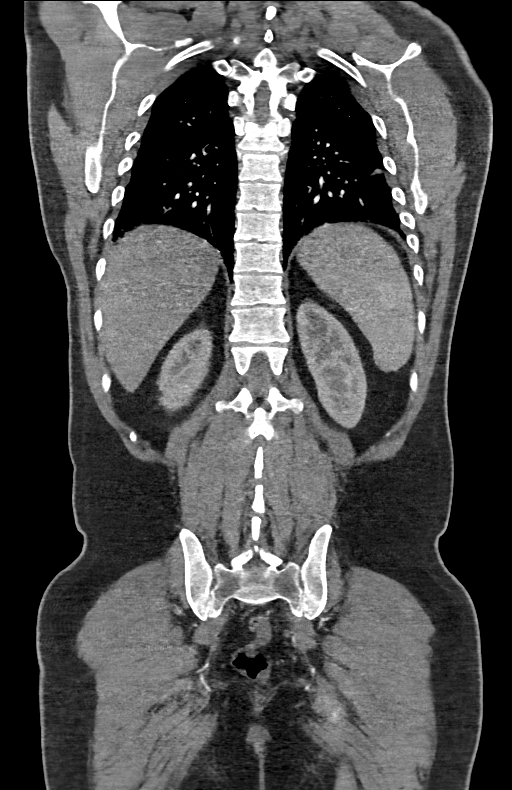

[13 of 46 positions shown; findings below may reference images not displayed]

Multidetector CT imaging through the chest, abdomen and pelvis was
performed using the standard protocol during bolus administration of
intravenous contrast. Multiplanar reconstructed images and MIPs were
obtained and reviewed to evaluate the vascular anatomy.

CONTRAST:  80mL OMNIPAQUE IOHEXOL 350 MG/ML SOLN
FINDINGS: CTA CHEST FINDINGS

Cardiovascular: The thoracic aorta is dilated in its ascending
segment measuring 4.0 x 4.0 cm on coronal image # 68 and sagittal
image # 92. The descending thoracic aorta is of normal caliber
measuring 2.8 cm in diameter beyond the takeoff of the left
subclavian artery and 2.4 cm in diameter at the level of the left
atrium. No intramural hematoma or dissection. The arch vasculature
demonstrates classic anatomic configuration and is widely patent. No
significant coronary artery calcification. Global cardiac size
within normal limits. No pericardial effusion. Central pulmonary
arteries are of normal caliber.

Mediastinum/Nodes: No enlarged mediastinal, hilar, or axillary lymph
nodes. Thyroid gland, trachea, and esophagus demonstrate no
significant findings.

Lungs/Pleura: Lungs are clear. No pleural effusion or pneumothorax.

Musculoskeletal: No acute bone abnormality. No lytic or blastic bone
lesion.

Review of the MIP images confirms the above findings.

CTA ABDOMEN AND PELVIS FINDINGS

VASCULAR

Aorta: Normal caliber aorta without aneurysm, dissection, vasculitis
or significant stenosis.

Celiac: Patent without evidence of aneurysm, dissection, vasculitis
or significant stenosis.

SMA: Patent without evidence of aneurysm, dissection, vasculitis or
significant stenosis.

Renals: 2 right and 4 left renal arteries are identified. No
evidence of hemodynamically significant stenosis. Normal vascular
morphology. No aneurysm.

IMA: Patent without evidence of aneurysm, dissection, vasculitis or
significant stenosis.

Inflow: Patent without evidence of aneurysm, dissection, vasculitis
or significant stenosis.

Veins: No obvious venous abnormality within the limitations of this
arterial phase study.

Review of the MIP images confirms the above findings.

NON-VASCULAR

Hepatobiliary: Cholelithiasis noted with distention of the
gallbladder, however, no pericholecystic inflammatory changes are
noted. Liver unremarkable. No intra or extrahepatic biliary ductal
dilation.

Pancreas: Unremarkable

Spleen: Unremarkable

Adrenals/Urinary Tract: Adrenal glands are unremarkable. Kidneys are
normal, without renal calculi, focal lesion, or hydronephrosis.
Bladder is unremarkable.

Stomach/Bowel: Mild sigmoid diverticulosis. The stomach, small
bowel, and large bowel are otherwise unremarkable. No evidence of
obstruction or focal inflammation. Appendix normal. No free
intraperitoneal gas or fluid.

Lymphatic: No pathologic adenopathy within the abdomen and pelvis.

Reproductive: Marked prostatic enlargement.

Other: Tiny fat containing umbilical hernia. The rectum is
unremarkable.

Musculoskeletal: No acute bone abnormality. No lytic or blastic bone
lesion identified.

Review of the MIP images confirms the above findings.
IMPRESSION: Mild dilation of the ascending thoracic aorta with maximal caliber
of 4.0 cm Recommend annual imaging followup by CTA or MRA. This
recommendation follows 5565
ACCF/AHA/AATS/ACR/ASA/SCA/PELE/MELODI/ADEKUNLE/GA Guidelines for the
Diagnosis and Management of Patients with Thoracic Aortic Disease.
Circulation. 5565; 121: E266-e369. Aortic aneurysm NOS
([id number].Z).

Normal caliber of the descending thoracic aorta and abdominal aorta.
No evidence of aortic dissection.

Cholelithiasis and gallbladder distension without superimposed
pericholecystic inflammatory change.

Mild sigmoid diverticulosis.

Marked prostatic enlargement.  The bladder is not distended.

No acute intra-abdominal pathology identified. No definite
radiographic explanation for the patient's reported symptoms.

## 2022-03-14 ENCOUNTER — Other Ambulatory Visit: Payer: Self-pay | Admitting: Family Medicine

## 2022-03-14 DIAGNOSIS — K219 Gastro-esophageal reflux disease without esophagitis: Secondary | ICD-10-CM

## 2022-04-15 ENCOUNTER — Other Ambulatory Visit: Payer: Self-pay | Admitting: Family Medicine

## 2022-04-18 ENCOUNTER — Telehealth: Payer: Self-pay

## 2022-04-18 NOTE — Telephone Encounter (Signed)
Patient states he is having up to 4 nose bleeds a week. He doesn't think it's dryness and has been using the recommended medication.   Please contact patient to schedule appt with Dr. Zigmund Daniel for nosebleeds. Thanks

## 2022-05-03 ENCOUNTER — Ambulatory Visit: Payer: Managed Care, Other (non HMO) | Admitting: Family Medicine

## 2022-09-07 ENCOUNTER — Other Ambulatory Visit: Payer: Self-pay | Admitting: Family Medicine

## 2022-09-07 DIAGNOSIS — K219 Gastro-esophageal reflux disease without esophagitis: Secondary | ICD-10-CM

## 2022-09-27 ENCOUNTER — Telehealth: Payer: Self-pay | Admitting: Family Medicine

## 2022-10-03 ENCOUNTER — Ambulatory Visit: Payer: Managed Care, Other (non HMO) | Admitting: Sports Medicine

## 2022-10-08 ENCOUNTER — Other Ambulatory Visit: Payer: Self-pay | Admitting: Family Medicine

## 2022-10-13 ENCOUNTER — Other Ambulatory Visit: Payer: Self-pay | Admitting: Family Medicine

## 2022-10-16 NOTE — Telephone Encounter (Signed)
Pls contact the patient to schedule HTN and medication follow-up appt with Dr. Ashley Royalty. No additional medication refills. Sending 30 day

## 2022-10-16 NOTE — Telephone Encounter (Signed)
Patient scheduled for 10/23/2022, thanks.

## 2022-10-23 ENCOUNTER — Encounter: Payer: Self-pay | Admitting: Family Medicine

## 2022-10-23 ENCOUNTER — Ambulatory Visit (INDEPENDENT_AMBULATORY_CARE_PROVIDER_SITE_OTHER): Payer: Managed Care, Other (non HMO) | Admitting: Family Medicine

## 2022-10-23 VITALS — BP 141/89 | HR 72 | Ht 70.0 in | Wt 197.0 lb

## 2022-10-23 DIAGNOSIS — I10 Essential (primary) hypertension: Secondary | ICD-10-CM

## 2022-10-23 DIAGNOSIS — Z1211 Encounter for screening for malignant neoplasm of colon: Secondary | ICD-10-CM | POA: Diagnosis not present

## 2022-10-23 DIAGNOSIS — Z0000 Encounter for general adult medical examination without abnormal findings: Secondary | ICD-10-CM

## 2022-10-23 DIAGNOSIS — E785 Hyperlipidemia, unspecified: Secondary | ICD-10-CM

## 2022-10-23 DIAGNOSIS — Z125 Encounter for screening for malignant neoplasm of prostate: Secondary | ICD-10-CM

## 2022-10-23 MED ORDER — LOSARTAN POTASSIUM 50 MG PO TABS: 50.0000 mg | ORAL_TABLET | Freq: Every day | ORAL | 1 refills | Status: DC

## 2022-10-23 NOTE — Progress Notes (Signed)
Roger Gonzalez - 57 y.o. male MRN [id number]  Date of birth: 06-Dec-1965  Subjective Chief Complaint  Patient presents with   Annual Exam    HPI Roger Gonzalez is a 57 y.o. male here today for annual exam.   He reports that he is doing ok.  BP has been running high recently.  He has had some b/l foot pain worse at the end of the day after being on his feet for long periods of time.   He is fairly active.  He is working on dietary changes but finds it hard to keep his sodium intake low.    Non-smoker.  Denies EtOH use at this time.   Review of Systems  Constitutional:  Negative for chills, fever, malaise/fatigue and weight loss.  HENT:  Negative for congestion, ear pain and sore throat.   Eyes:  Negative for blurred vision, double vision and pain.  Respiratory:  Negative for cough and shortness of breath.   Cardiovascular:  Negative for chest pain and palpitations.  Gastrointestinal:  Negative for abdominal pain, blood in stool, constipation, heartburn and nausea.  Genitourinary:  Negative for dysuria and urgency.  Musculoskeletal:  Negative for joint pain and myalgias.  Neurological:  Negative for dizziness and headaches.  Endo/Heme/Allergies:  Does not bruise/bleed easily.  Psychiatric/Behavioral:  Negative for depression. The patient is not nervous/anxious and does not have insomnia.      Allergies  Allergen Reactions   Barbiturates Other (See Comments)    Hyperactivity, depression   Other Other (See Comments)    Steroids cause mania   Penicillins Hives and Rash    Has patient had a PCN reaction causing immediate rash, facial/tongue/throat swelling, SOB or lightheadedness with hypotension: Yes Has patient had a PCN reaction causing severe rash involving mucus membranes or skin necrosis: No Has patient had a PCN reaction that required hospitalization No Has patient had a PCN reaction occurring within the last 10 years: Yes If all of the above answers are "NO", then  may proceed with Cephalosporin use. Has patient had a PCN reaction causing immediate rash, facial/tongue/throat swelling, SOB or lightheadedness with hypotension: Yes Has patient had a PCN reaction causing severe rash involving mucus membranes or skin necrosis: No Has patient had a PCN reaction that required hospitalization No Has patient had a PCN reaction occurring within the last 10 years: Yes If all of the above answers are "NO", then may proceed with Cephalosporin use.   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   Qualifier: Diagnosis of  By: Thomos Lemons      Past Surgical History:  Procedure Laterality Date   TONSILLECTOMY      Social History   Socioeconomic History   Marital status: Single    Spouse name: Not on file   Number of children: Not on file   Years of education: Not on file   Highest education level: Not on file  Occupational History   Not on file  Tobacco Use   Smoking status: Former   Smokeless tobacco: Former    Quit date: 05/05/2000  Substance and Sexual Activity   Alcohol use: No    Alcohol/week: 0.0 standard drinks of alcohol   Drug use: No   Sexual activity: Not  on file  Other Topics Concern   Not on file  Social History Narrative   Not on file   Social Determinants of Health   Financial Resource Strain: Not on file  Food Insecurity: Not on file  Transportation Needs: Not on file  Physical Activity: Not on file  Stress: Not on file  Social Connections: Unknown (06/01/2021)   Received from [facility]   Social Network    Social Network: Not on file    Family History  Problem Relation Age of Onset   Hypertension Mother    Bipolar disorder Father     Health Maintenance   Topic Date Due   COVID-19 Vaccine (2 - 2023-24 season) 12/09/2022 (Originally 09/23/2022)   Hepatitis C Screening  12/23/2022 (Originally 12/01/1984)   HIV Screening  12/23/2022 (Originally 12/01/1981)   Zoster Vaccines- Shingrix (1 of 2) 01/23/2023 (Originally 12/01/2016)   INFLUENZA VACCINE  04/22/2023 (Originally 08/23/2022)   Colonoscopy  10/23/2023 (Originally 12/02/2011)   DTaP/Tdap/Td (3 - Td or Tdap) 06/30/2030   HPV VACCINES  Aged Out   Lung Cancer Screening  Discontinued     ----------------------------------------------------------------------------------------------------------------------------------------------------------------------------------------------------------------- Physical Exam BP (!) 141/89 (BP Location: Right Arm, Patient Position: Sitting, Cuff Size: Normal)   Pulse 72   Ht 5\' 10"  (1.778 m)   Wt 197 lb (89.4 kg)   SpO2 96%   BMI 28.27 kg/m   Physical Exam Constitutional:      General: He is not in acute distress. HENT:     Head: Normocephalic and atraumatic.     Right Ear: Tympanic membrane and external ear normal.     Left Ear: Tympanic membrane and external ear normal.  Eyes:     General: No scleral icterus. Neck:     Thyroid: No thyromegaly.  Cardiovascular:     Rate and Rhythm: Normal rate and regular rhythm.     Heart sounds: Normal heart sounds.  Pulmonary:     Effort: Pulmonary effort is normal.     Breath sounds: Normal breath sounds.  Abdominal:     General: Bowel sounds are normal. There is no distension.     Palpations: Abdomen is soft.     Tenderness: There is no abdominal tenderness. There is no guarding.  Musculoskeletal:     Cervical back: Normal range of motion.  Lymphadenopathy:     Cervical: No cervical adenopathy.  Skin:    General: Skin is warm and dry.     Findings: No rash.  Neurological:     Mental Status: He is alert and oriented to person, place, and time.     Cranial Nerves: No cranial nerve deficit.      Motor: No abnormal muscle tone.  Psychiatric:        Mood and Affect: Mood normal.        Behavior: Behavior normal.     ------------------------------------------------------------------------------------------------------------------------------------------------------------------------------------------------------------------- Assessment and Plan  Well adult exam Well adult Orders Placed This Encounter  Procedures   CBC with Differential/Platelet   CMP14+EGFR   Lipid Panel With LDL/HDL Ratio   PSA   Ambulatory referral to Gastroenterology    Referral Priority:   Routine    Referral Type:   Consultation    Referral Reason:   Specialty Services Required    Number of Visits Requested:   1  Screenings: per lab orders Immunizations:  Declines Anticipatory guidance/Risk factor reduction: Recommendations per AVS.   Essential hypertension BP elevated increase losartan to 50mg  daily.     Meds ordered  this encounter  Medications   losartan (COZAAR) 50 MG tablet    Sig: Take 1 tablet (50 mg total) by mouth daily.    Dispense:  90 tablet    Refill:  1    No follow-ups on file.    This visit occurred during the SARS-CoV-2 public health emergency.  Safety protocols were in place, including screening questions prior to the visit, additional usage of staff PPE, and extensive cleaning of exam room while observing appropriate contact time as indicated for disinfecting solutions.

## 2022-10-23 NOTE — Assessment & Plan Note (Signed)
Well adult Orders Placed This Encounter  Procedures   CBC with Differential/Platelet   CMP14+EGFR   Lipid Panel With LDL/HDL Ratio   PSA   Ambulatory referral to Gastroenterology    Referral Priority:   Routine    Referral Type:   Consultation    Referral Reason:   Specialty Services Required    Number of Visits Requested:   1  Screenings: per lab orders Immunizations:  Declines Anticipatory guidance/Risk factor reduction: Recommendations per AVS.

## 2022-10-23 NOTE — Patient Instructions (Addendum)
Increase losartan to 50mg  daily.     Preventive Care 67-56 Years Old, Male Preventive care refers to lifestyle choices and visits with your health care provider that can promote health and wellness. Preventive care visits are also called wellness exams. What can I expect for my preventive care visit? Counseling During your preventive care visit, your health care provider may ask about your: Medical history, including: Past medical problems. Family medical history. Current health, including: Emotional well-being. Home life and relationship well-being. Sexual activity. Lifestyle, including: Alcohol, nicotine or tobacco, and drug use. Access to firearms. Diet, exercise, and sleep habits. Safety issues such as seatbelt and bike helmet use. Sunscreen use. Work and work Astronomer. Physical exam Your health care provider will check your: Height and weight. These may be used to calculate your BMI (body mass index). BMI is a measurement that tells if you are at a healthy weight. Waist circumference. This measures the distance around your waistline. This measurement also tells if you are at a healthy weight and may help predict your risk of certain diseases, such as type 2 diabetes and high blood pressure. Heart rate and blood pressure. Body temperature. Skin for abnormal spots. What immunizations do I need?  Vaccines are usually given at various ages, according to a schedule. Your health care provider will recommend vaccines for you based on your age, medical history, and lifestyle or other factors, such as travel or where you work. What tests do I need? Screening Your health care provider may recommend screening tests for certain conditions. This may include: Lipid and cholesterol levels. Diabetes screening. This is done by checking your blood sugar (glucose) after you have not eaten for a while (fasting). Hepatitis B test. Hepatitis C test. HIV (human immunodeficiency virus)  test. STI (sexually transmitted infection) testing, if you are at risk. Lung cancer screening. Prostate cancer screening. Colorectal cancer screening. Talk with your health care provider about your test results, treatment options, and if necessary, the need for more tests. Follow these instructions at home: Eating and drinking  Eat a diet that includes fresh fruits and vegetables, whole grains, lean protein, and low-fat dairy products. Take vitamin and mineral supplements as recommended by your health care provider. Do not drink alcohol if your health care provider tells you not to drink. If you drink alcohol: Limit how much you have to 0-2 drinks a day. Know how much alcohol is in your drink. In the [place] one drink equals one 12 oz bottle of beer (355 mL), one 5 oz glass of wine (148 mL), or one 1 oz glass of hard liquor (44 mL). Lifestyle Brush your teeth every morning and night with fluoride toothpaste. Floss one time each day. Exercise for at least 30 minutes 5 or more days each week. Do not use any products that contain nicotine or tobacco. These products include cigarettes, chewing tobacco, and vaping devices, such as e-cigarettes. If you need help quitting, ask your health care provider. Do not use drugs. If you are sexually active, practice safe sex. Use a condom or other form of protection to prevent STIs. Take aspirin only as told by your health care provider. Make sure that you understand how much to take and what form to take. Work with your health care provider to find out whether it is safe and beneficial for you to take aspirin daily. Find healthy ways to manage stress, such as: Meditation, yoga, or listening to music. Journaling. Talking to a trusted person. Spending time with friends  and family. Minimize exposure to UV radiation to reduce your risk of skin cancer. Safety Always wear your seat belt while driving or riding in a vehicle. Do not drive: If you have been  drinking alcohol. Do not ride with someone who has been drinking. When you are tired or distracted. While texting. If you have been using any mind-altering substances or drugs. Wear a helmet and other protective equipment during sports activities. If you have firearms in your house, make sure you follow all gun safety procedures. What's next? Go to your health care provider once a year for an annual wellness visit. Ask your health care provider how often you should have your eyes and teeth checked. Stay up to date on all vaccines. This information is not intended to replace advice given to you by your health care provider. Make sure you discuss any questions you have with your health care provider. Document Revised: 07/06/2020 Document Reviewed: 07/06/2020 Elsevier Patient Education  2024 [employer].

## 2022-10-23 NOTE — Assessment & Plan Note (Signed)
BP elevated increase losartan to 50mg  daily.

## 2022-10-24 LAB — CBC WITH DIFFERENTIAL/PLATELET
Basophils Absolute: 0 10*3/uL (ref 0.0–0.2)
Basos: 0 %
EOS (ABSOLUTE): 0 10*3/uL (ref 0.0–0.4)
Eos: 1 %
Hematocrit: 48 % (ref 37.5–51.0)
Hemoglobin: 16.3 g/dL (ref 13.0–17.7)
Immature Grans (Abs): 0 10*3/uL (ref 0.0–0.1)
Immature Granulocytes: 0 %
Lymphocytes Absolute: 1.4 10*3/uL (ref 0.7–3.1)
Lymphs: 20 %
MCH: 33.1 pg — ABNORMAL HIGH (ref 26.6–33.0)
MCHC: 34 g/dL (ref 31.5–35.7)
MCV: 97 fL (ref 79–97)
Monocytes Absolute: 0.5 10*3/uL (ref 0.1–0.9)
Monocytes: 7 %
Neutrophils Absolute: 5.1 10*3/uL (ref 1.4–7.0)
Neutrophils: 72 %
Platelets: 185 10*3/uL (ref 150–450)
RBC: 4.93 x10E6/uL (ref 4.14–5.80)
RDW: 12.3 % (ref 11.6–15.4)
WBC: 7.1 10*3/uL (ref 3.4–10.8)

## 2022-10-24 LAB — CMP14+EGFR
ALT: 36 [IU]/L (ref 0–44)
AST: 25 [IU]/L (ref 0–40)
Albumin: 4.5 g/dL (ref 3.8–4.9)
Alkaline Phosphatase: 76 [IU]/L (ref 44–121)
BUN/Creatinine Ratio: 22 — ABNORMAL HIGH (ref 9–20)
BUN: 19 mg/dL (ref 6–24)
Bilirubin Total: 0.3 mg/dL (ref 0.0–1.2)
CO2: 22 mmol/L (ref 20–29)
Calcium: 9.4 mg/dL (ref 8.7–10.2)
Chloride: 104 mmol/L (ref 96–106)
Creatinine, Ser: 0.88 mg/dL (ref 0.76–1.27)
Globulin, Total: 1.8 g/dL (ref 1.5–4.5)
Glucose: 100 mg/dL — ABNORMAL HIGH (ref 70–99)
Potassium: 4.1 mmol/L (ref 3.5–5.2)
Sodium: 141 mmol/L (ref 134–144)
Total Protein: 6.3 g/dL (ref 6.0–8.5)
eGFR: 102 mL/min/{1.73_m2} (ref >59)

## 2022-10-24 LAB — LIPID PANEL WITH LDL/HDL RATIO
Cholesterol, Total: 248 mg/dL — ABNORMAL HIGH (ref 100–199)
HDL: 45 mg/dL (ref >39)
LDL Chol Calc (NIH): 160 mg/dL — ABNORMAL HIGH (ref 0–99)
LDL/HDL Ratio: 3.6 {ratio} (ref 0.0–3.6)
Triglycerides: 235 mg/dL — ABNORMAL HIGH (ref 0–149)
VLDL Cholesterol Cal: 43 mg/dL — ABNORMAL HIGH (ref 5–40)

## 2022-10-24 LAB — PSA: Prostate Specific Ag, Serum: 0.6 ng/mL (ref 0.0–4.0)

## 2022-10-26 ENCOUNTER — Encounter: Payer: Self-pay | Admitting: Medical-Surgical

## 2022-10-26 ENCOUNTER — Ambulatory Visit: Payer: Managed Care, Other (non HMO) | Admitting: Medical-Surgical

## 2022-10-26 ENCOUNTER — Ambulatory Visit: Payer: Managed Care, Other (non HMO)

## 2022-10-26 VITALS — BP 144/78 | HR 74 | Resp 20 | Ht 70.0 in | Wt 192.5 lb

## 2022-10-26 DIAGNOSIS — M542 Cervicalgia: Secondary | ICD-10-CM

## 2022-10-26 DIAGNOSIS — E785 Hyperlipidemia, unspecified: Secondary | ICD-10-CM | POA: Diagnosis not present

## 2022-10-26 DIAGNOSIS — R202 Paresthesia of skin: Secondary | ICD-10-CM | POA: Diagnosis not present

## 2022-10-26 DIAGNOSIS — R29898 Other symptoms and signs involving the musculoskeletal system: Secondary | ICD-10-CM | POA: Diagnosis not present

## 2022-10-26 DIAGNOSIS — I10 Essential (primary) hypertension: Secondary | ICD-10-CM

## 2022-10-26 MED ORDER — ATORVASTATIN CALCIUM 10 MG PO TABS: 10.0000 mg | ORAL_TABLET | Freq: Every day | ORAL | 3 refills | Status: DC

## 2022-10-26 NOTE — Progress Notes (Signed)
Established Patient Office Visit  Subjective   Patient ID: Roger Gonzalez, male   DOB: 1965/06/18 Age: 57 y.o. MRN: [id number]   Chief Complaint  Patient presents with   Neck Pain   FEET PAIN   Leg Pain    HPI Pleasant 57 year old male presenting today for multiple complaints including neck pain, elevated blood pressure, headaches, dizziness, foot and leg heaviness/tingling, pain down his legs all the way to his feet, and shaking today.  Also reports that he has lost 5 pounds in the last 3 days.  He saw his PCP on 10/1 for an annual physical exam and has not seen his lab work for recommendations for that due to difficulty getting to MyChart.  At his physical appointment, his blood pressure was elevated so his losartan was increased to 50 mg daily and he wonders if this may be has something to do with his very symptoms.   Objective:    Vitals:   10/26/22 1608  BP: (!) 144/78  Pulse: 74  Resp: 20  Height: 5\' 10"  (1.778 m)  Weight: 192 lb 8 oz (87.3 kg)  SpO2: 97%  BMI (Calculated): 27.62    Physical Exam Vitals reviewed.  Constitutional:      General: He is not in acute distress.    Appearance: Normal appearance. He is not ill-appearing.  HENT:     Head: Normocephalic.  Cardiovascular:     Rate and Rhythm: Normal rate and regular rhythm.     Pulses: Normal pulses.  Pulmonary:     Effort: Pulmonary effort is normal. No respiratory distress.  Musculoskeletal:        General: Normal range of motion.     Right lower leg: No edema.     Left lower leg: No edema.  Skin:    General: Skin is warm and dry.  Neurological:     Mental Status: He is alert and oriented to person, place, and time.  Psychiatric:        Attention and Perception: He is inattentive.        Mood and Affect: Mood is anxious.        Speech: Speech is rapid and pressured.        Behavior: Behavior normal. Behavior is cooperative.        Thought Content: Thought content normal.        Cognition and  Memory: Cognition normal.        Judgment: Judgment normal.   No results found for this or any previous visit (from the past 24 hour(s)).     The 10-year ASCVD risk score (Arnett DK, et al., 2019) is: 11.2%   Values used to calculate the score:     Age: 56 years     Sex: Male     Is Non-Hispanic African American: No     Diabetic: No     Tobacco smoker: No     Systolic Blood Pressure: 144 mmHg     Is BP treated: Yes     HDL Cholesterol: 45 mg/dL     Total Cholesterol: 248 mg/dL   Assessment & Plan:   1. Arm paresthesia, left With complaints of neck pain and associated left arm paresthesias, suspect cervical spine etiology.  Getting x-rays today. - DG Cervical Spine Complete; Future  2. Leg heaviness On review of his chart, he has had lumbar spine x-rays already with no concerning findings.  Concern for venous insufficiency versus intermittent claudication.  Would like to pursue  nerve conduction study as well as ABIs.  Also plan for bilateral lower extremity ultrasound for further evaluation.  3. Hyperlipidemia with target LDL less than 160 Reviewed recent lab results and PCP recommendations for starting a statin medication.  Patient is very anxious regarding dietary modifications and lifestyle changes that were recommended.  After discussion, he is willing to start a statin so adding Lipitor 10 mg daily.  Information provided with AVS regarding a low-fat heart healthy diet and management of hyperlipidemia that he can review this at home.  4. Essential hypertension Blood pressure is still somewhat elevated today.  He is very anxious and jittery during the appointment with considerable worries about health concerns.  Plan to continue losartan 50 mg daily as prescribed by PCP.  He did not make an appointment for a 2-week nurse visit so have encouraged him to do this.  Would like him to follow-up closely with his PCP for hypertension management and discussion of other multiple health  concerns.  Return in about 2 weeks (around 11/09/2022) for nurse visit for BP check.  ___________________________________________ Thayer Ohm, DNP, APRN, FNP-BC Primary Care and Sports Medicine [facility] [place]

## 2022-10-26 NOTE — Patient Instructions (Addendum)
Today: ordering the following  Neck x-rays for upper extremity numbness Ankle brachial index Lower extremity ultrasound to evaluate blood flow/vascular issues  Start atorvastatin (Lipitor) 10mg  daily.

## 2022-11-02 ENCOUNTER — Ambulatory Visit (HOSPITAL_BASED_OUTPATIENT_CLINIC_OR_DEPARTMENT_OTHER): Admission: RE | Admit: 2022-11-02 | Payer: Managed Care, Other (non HMO) | Source: Ambulatory Visit

## 2022-11-02 ENCOUNTER — Telehealth (HOSPITAL_BASED_OUTPATIENT_CLINIC_OR_DEPARTMENT_OTHER): Payer: Self-pay

## 2022-11-06 ENCOUNTER — Telehealth: Payer: Self-pay | Admitting: Family Medicine

## 2022-11-06 NOTE — Telephone Encounter (Signed)
Patient called he didn't receive his refills on Losartan 50mg  this one he is out of Needs refills on all medications Please submit to  [facility] [address] [place] [address] Phone : [phone]

## 2022-11-08 ENCOUNTER — Other Ambulatory Visit: Payer: Self-pay | Admitting: Family Medicine

## 2022-11-09 ENCOUNTER — Telehealth: Payer: Self-pay

## 2022-11-09 ENCOUNTER — Ambulatory Visit (INDEPENDENT_AMBULATORY_CARE_PROVIDER_SITE_OTHER): Payer: Managed Care, Other (non HMO)

## 2022-11-09 ENCOUNTER — Other Ambulatory Visit: Payer: Self-pay

## 2022-11-09 VITALS — BP 126/74 | HR 58 | Ht 70.0 in | Wt 195.0 lb

## 2022-11-09 DIAGNOSIS — I10 Essential (primary) hypertension: Secondary | ICD-10-CM

## 2022-11-09 MED ORDER — LOSARTAN POTASSIUM 50 MG PO TABS: 50.0000 mg | ORAL_TABLET | Freq: Every day | ORAL | 1 refills | Status: DC

## 2022-11-09 NOTE — Progress Notes (Signed)
   Established Patient Office Visit  Subjective   Patient ID: BRENTIN ALANA, male    DOB: 04-Mar-1965  Age: 57 y.o. MRN: [id number]  Chief Complaint  Patient presents with   Hypertension    BP check nurse visit.     HPI  Hypertension-BP check nurse visit. Patient denies chest pain, shortness of breath, dizziness, palpitations, or medication problems.  Patient states he has been out of the Losartan x couple of weeks due to medication being sent to wrong pharmacy. Patient feels that feet/ leg pain has increased without BP Medication. Patient declines all vaccines.   ROS    Objective:     BP 126/74   Pulse (!) 58   Ht 5\' 10"  (1.778 m)   Wt 195 lb (88.5 kg)   SpO2 98%   BMI 27.98 kg/m    Physical Exam   No results found for any visits on 11/09/22.    The 10-year ASCVD risk score (Arnett DK, et al., 2019) is: 8.9%    Assessment & Plan:  BP check nurse visit. Initial reading = 126/74. Second reading = 124/78. Patient informed to schedule a 3  month follow up with Dr. Ashley Royalty.  Problem List Items Addressed This Visit       Cardiovascular and Mediastinum   Essential hypertension - Primary    Return in about 3 months (around 02/09/2023) for HTN follow up with Dr. Ashley Royalty. Elizabeth Palau, LPN

## 2022-11-09 NOTE — Telephone Encounter (Signed)
Error no encounter needed.

## 2022-11-09 NOTE — Patient Instructions (Signed)
Schedule a 3 month follow up with Dr. Ashley Royalty.

## 2022-11-09 NOTE — Telephone Encounter (Signed)
Patient in office for nurse visit. States he has not heard back regarding his cervical x-ray results.  In checking his chart - does not show that this has yet been read.  Would you like me to call and have the radiology room move this up for reading?

## 2022-11-12 NOTE — Telephone Encounter (Signed)
Please have xray read, it has been nearly 3 weeks.  Thanks!

## 2022-11-13 ENCOUNTER — Telehealth: Payer: Self-pay | Admitting: Family Medicine

## 2022-11-13 NOTE — Telephone Encounter (Signed)
Patient has questions about imaging results

## 2022-11-13 NOTE — Telephone Encounter (Signed)
Spoke with radiology reading room. They will move this x-ray up the list for resulting.

## 2022-11-16 ENCOUNTER — Telehealth: Payer: Self-pay | Admitting: Family Medicine

## 2022-11-16 ENCOUNTER — Other Ambulatory Visit: Payer: Self-pay | Admitting: Medical-Surgical

## 2022-11-16 DIAGNOSIS — I10 Essential (primary) hypertension: Secondary | ICD-10-CM

## 2022-11-16 DIAGNOSIS — E785 Hyperlipidemia, unspecified: Secondary | ICD-10-CM

## 2022-11-16 DIAGNOSIS — R29898 Other symptoms and signs involving the musculoskeletal system: Secondary | ICD-10-CM

## 2022-11-16 DIAGNOSIS — I739 Peripheral vascular disease, unspecified: Secondary | ICD-10-CM

## 2022-11-16 DIAGNOSIS — R202 Paresthesia of skin: Secondary | ICD-10-CM

## 2022-11-16 NOTE — Telephone Encounter (Signed)
Patient called requesting to speak to Christen Butter about test results and the next steps for PT.

## 2022-11-22 NOTE — Telephone Encounter (Signed)
LMVM for the patient to contact the office. 

## 2022-11-23 NOTE — Telephone Encounter (Signed)
Patient returned your call.

## 2022-12-06 ENCOUNTER — Ambulatory Visit (HOSPITAL_BASED_OUTPATIENT_CLINIC_OR_DEPARTMENT_OTHER): Payer: Managed Care, Other (non HMO) | Attending: Medical-Surgical

## 2023-01-25 ENCOUNTER — Ambulatory Visit: Payer: Managed Care, Other (non HMO) | Admitting: Sports Medicine

## 2023-01-25 ENCOUNTER — Ambulatory Visit: Payer: Self-pay | Admitting: Family Medicine

## 2023-01-25 ENCOUNTER — Encounter: Payer: Self-pay | Admitting: Sports Medicine

## 2023-01-25 VITALS — BP 121/71 | HR 63 | Ht 70.0 in | Wt 194.0 lb

## 2023-01-25 DIAGNOSIS — F5221 Male erectile disorder: Secondary | ICD-10-CM | POA: Diagnosis not present

## 2023-01-25 DIAGNOSIS — M545 Low back pain, unspecified: Secondary | ICD-10-CM | POA: Diagnosis not present

## 2023-01-25 DIAGNOSIS — R109 Unspecified abdominal pain: Secondary | ICD-10-CM | POA: Insufficient documentation

## 2023-01-25 MED ORDER — CIPROFLOXACIN HCL 750 MG PO TABS: 750.0000 mg | ORAL_TABLET | Freq: Two times a day (BID) | ORAL | 0 refills | Status: AC

## 2023-01-25 MED ORDER — TADALAFIL 10 MG PO TABS: 10.0000 mg | ORAL_TABLET | ORAL | 1 refills | Status: DC | PRN

## 2023-01-25 MED ORDER — METRONIDAZOLE 500 MG PO TABS: 500.0000 mg | ORAL_TABLET | Freq: Two times a day (BID) | ORAL | 0 refills | Status: AC

## 2023-01-25 NOTE — Telephone Encounter (Signed)
 Copied from CRM [id number]. Topic: Clinical - Pink Word Triage >> Jan 25, 2023  8:39 AM Curlee DEL wrote: Reason for Triage: Patient is experiencing stomach pains, bowel movements/diarrhea, lower back pain, gas, headaches and nausea. Patient also mentioned that his legs feel very tired. He would like a call from a nurse.    Chief Complaint: Back pain; Gas Pain Symptoms: Back pain, headache, numbness in both legs Frequency: constant Pertinent Negatives: Patient denies Fall or overuse Disposition: [] ED /[] Urgent Care (no appt availability in office) / [x] Appointment(In office/virtual)/ []  [place] [place] [facility]/ [] Home Care/ [] Refused Recommended Disposition /[] [facility] [facility]/ []  Follow-up with PCP Additional Notes: Patient reports multiple complaint. Endorses back pain, abd pain, leg numbness, headache, and soft stool. Patient became frustrated with RN during triage, states I dont have time for this I am slammed at work and I will explain everything to the doctor when I get there appt scheduled for today at 2:30p     Reason for Disposition  [1] MODERATE back pain (e.g., interferes with normal activities) AND [2] present > 3 days  Answer Assessment - Initial Assessment Questions 1. ONSET: When did the pain begin?      4-5 days ago  2. LOCATION: Where does it hurt? (upper, mid or lower back)     Lower back; both side  3. SEVERITY: How bad is the pain?  (e.g., Scale 1-10; mild, moderate, or severe)   - MILD (1-3): Doesn't interfere with normal activities.    - MODERATE (4-7): Interferes with normal activities or awakens from sleep.    - SEVERE (8-10): Excruciating pain, unable to do any normal activities.     Moderate pain  4. PATTERN: Is the pain constant? (e.g., yes, no; constant, intermittent)      Constant  5. RADIATION: Does the pain shoot into your legs or somewhere else?     Shoot down both legs  6. CAUSE:  What do you think is causing the back pain?       Reports having gas pain that started first that he feels is pushing into his back  7. BACK OVERUSE:  Any recent lifting of heavy objects, strenuous work or exercise?     No overuse  8. MEDICINES: What have you taken so far for the pain? (e.g., nothing, acetaminophen , NSAIDS)     OTC pain meds  9. NEUROLOGIC SYMPTOMS: Do you have any weakness, numbness, or problems with bowel/bladder control?     Reports soft bowels  10. OTHER SYMPTOMS: Do you have any other symptoms? (e.g., fever, abdomen pain, burning with urination, blood in urine)       Abdominal pain, headache, soft stools  Protocols used: Back Pain-A-AH

## 2023-01-25 NOTE — Progress Notes (Signed)
    Procedures performed today:    None.  Independent interpretation of notes and tests performed by another provider:   None.  Brief History, Exam, Impression, and Recommendations:    Acute abdominal pain Acute onset left lower quadrant pain with diarrhea, sometimes hematochezia. No nausea or vomiting, large amount of flatulence. Pain does radiate to his back. He does have a headache as well. Suspect viral colitis versus diverticulitis. We will do a workup including CBC with differential, CMP, urinalysis, amylase and lipase. C. difficile stool testing, CT of the abdomen and pelvis as well as Flagyl  and Cipro . Return to see me in 1 to 2 weeks.  Erectile disorder, acquired, generalized, severe Patient took 5 of Cialis  and did not have efficacy, sildenafil  was losing it to see. He tried penile compressive ring without sufficient improvement. I do think we need to give Cialis  due diligence of 10 and 20 mg dosing. If insufficient improvement he can discuss intracavernosal prostaglandins with urology.   Low back pain Left-sided cervical radicular symptoms, lumbar radicular symptoms, we can discuss this at a follow-up visit.    ____________________________________________ Debby PARAS. Curtis, M.D., ABFM., CAQSM., AME. Primary Care and Sports Medicine [facility]  Adjunct Professor of [facility]  [employer]  Restaurant Manager, [employer]

## 2023-01-25 NOTE — Assessment & Plan Note (Signed)
 Patient took 5 of Cialis  and did not have efficacy, sildenafil  was losing it to see. He tried penile compressive ring without sufficient improvement. I do think we need to give Cialis  due diligence of 10 and 20 mg dosing. If insufficient improvement he can discuss intracavernosal prostaglandins with urology.

## 2023-01-25 NOTE — Assessment & Plan Note (Signed)
 Acute onset left lower quadrant pain with diarrhea, sometimes hematochezia. No nausea or vomiting, large amount of flatulence. Pain does radiate to his back. He does have a headache as well. Suspect viral colitis versus diverticulitis. We will do a workup including CBC with differential, CMP, urinalysis, amylase and lipase. C. difficile stool testing, CT of the abdomen and pelvis as well as Flagyl  and Cipro . Return to see me in 1 to 2 weeks.

## 2023-01-25 NOTE — Assessment & Plan Note (Signed)
 Left-sided cervical radicular symptoms, lumbar radicular symptoms, we can discuss this at a follow-up visit.

## 2023-01-28 ENCOUNTER — Ambulatory Visit: Payer: Managed Care, Other (non HMO)

## 2023-01-28 DIAGNOSIS — R197 Diarrhea, unspecified: Secondary | ICD-10-CM | POA: Diagnosis not present

## 2023-01-28 DIAGNOSIS — R1032 Left lower quadrant pain: Secondary | ICD-10-CM

## 2023-01-28 DIAGNOSIS — K921 Melena: Secondary | ICD-10-CM

## 2023-01-28 DIAGNOSIS — R109 Unspecified abdominal pain: Secondary | ICD-10-CM

## 2023-01-28 MED ORDER — IOHEXOL 9 MG/ML PO SOLN: 500.0000 mL | ORAL | Status: AC

## 2023-01-28 MED ORDER — IOHEXOL 300 MG/ML  SOLN
100.0000 mL | Freq: Once | INTRAMUSCULAR | Status: AC | PRN
  Administered 2023-01-28: 100 mL via INTRAVENOUS

## 2023-01-30 LAB — CLOSTRIDIUM DIFFICILE EIA

## 2023-01-30 LAB — SPECIMEN STATUS REPORT

## 2023-02-02 LAB — STOOL CULTURE: E coli, Shiga toxin Assay: NEGATIVE

## 2023-02-02 LAB — SPECIMEN STATUS REPORT

## 2023-02-08 ENCOUNTER — Encounter: Payer: Self-pay | Admitting: Sports Medicine

## 2023-02-08 ENCOUNTER — Ambulatory Visit: Payer: Managed Care, Other (non HMO)

## 2023-02-08 ENCOUNTER — Ambulatory Visit: Payer: Managed Care, Other (non HMO) | Admitting: Sports Medicine

## 2023-02-08 DIAGNOSIS — M79604 Pain in right leg: Secondary | ICD-10-CM | POA: Diagnosis not present

## 2023-02-08 DIAGNOSIS — M545 Low back pain, unspecified: Secondary | ICD-10-CM

## 2023-02-08 DIAGNOSIS — M5412 Radiculopathy, cervical region: Secondary | ICD-10-CM | POA: Insufficient documentation

## 2023-02-08 DIAGNOSIS — F5221 Male erectile disorder: Secondary | ICD-10-CM

## 2023-02-08 DIAGNOSIS — M5416 Radiculopathy, lumbar region: Secondary | ICD-10-CM

## 2023-02-08 DIAGNOSIS — M79605 Pain in left leg: Secondary | ICD-10-CM | POA: Diagnosis not present

## 2023-02-08 MED ORDER — CELECOXIB 200 MG PO CAPS: ORAL_CAPSULE | ORAL | 2 refills | Status: DC

## 2023-02-08 MED ORDER — SILDENAFIL CITRATE 100 MG PO TABS: 50.0000 mg | ORAL_TABLET | Freq: Every day | ORAL | 11 refills | Status: DC | PRN

## 2023-02-08 NOTE — Assessment & Plan Note (Signed)
Left-sided cervical radiculopathy, x-rays do show cervical DDD, adding home PT, Celebrex as above, return to see me in 6 weeks, MRI if not better.

## 2023-02-08 NOTE — Assessment & Plan Note (Signed)
Bilateral L5 distribution radiculopathy without red flag symptoms, we discussed the anatomy and pathophysiology, patient is unable to take steroids due to precipitation of mania. Adding Celebrex, lumbar spine x-rays, home physical therapy, he understands that he needs to do this consistently for 6 weeks and he will call me if he feels like he needs to transition to formal PT. If not better in 6 weeks we will proceed with MRI.

## 2023-02-08 NOTE — Progress Notes (Signed)
    Procedures performed today:    None.  Independent interpretation of notes and tests performed by another provider:   None.  Brief History, Exam, Impression, and Recommendations:    Bilateral lumbar radiculopathy Bilateral L5 distribution radiculopathy without red flag symptoms, we discussed the anatomy and pathophysiology, patient is unable to take steroids due to precipitation of mania. Adding Celebrex, lumbar spine x-rays, home physical therapy, he understands that he needs to do this consistently for 6 weeks and he will call me if he feels like he needs to transition to formal PT. If not better in 6 weeks we will proceed with MRI.  Left cervical radiculopathy Left-sided cervical radiculopathy, x-rays do show cervical DDD, adding home PT, Celebrex as above, return to see me in 6 weeks, MRI if not better.   Erectile disorder, acquired, generalized, severe Did not respond to 20 mg of tadalafil, compressive rings, we will switch him back to Viagra. Not interested in intracavernosal prostaglandins with urology.    ____________________________________________ Ihor Austin. Benjamin Stain, M.D., ABFM., CAQSM., AME. Primary Care and Sports Medicine [facility]  Adjunct Professor of Family Medicine  [place] [employer]  Restaurant manager, fast food

## 2023-02-08 NOTE — Assessment & Plan Note (Signed)
Did not respond to 20 mg of tadalafil, compressive rings, we will switch him back to Viagra. Not interested in intracavernosal prostaglandins with urology.

## 2023-02-14 ENCOUNTER — Emergency Department (HOSPITAL_COMMUNITY): Payer: Managed Care, Other (non HMO)

## 2023-02-14 ENCOUNTER — Other Ambulatory Visit: Payer: Self-pay

## 2023-02-14 ENCOUNTER — Emergency Department (HOSPITAL_COMMUNITY): Payer: Worker's Compensation

## 2023-02-14 ENCOUNTER — Emergency Department (HOSPITAL_COMMUNITY)
Admission: EM | Admit: 2023-02-14 | Discharge: 2023-02-14 | Disposition: A | Discharge status: Home / Self Care | Payer: Worker's Compensation | Attending: Emergency Medicine | Admitting: Emergency Medicine

## 2023-02-14 ENCOUNTER — Encounter: Payer: Self-pay | Admitting: Sports Medicine

## 2023-02-14 DIAGNOSIS — I10 Essential (primary) hypertension: Secondary | ICD-10-CM | POA: Diagnosis not present

## 2023-02-14 DIAGNOSIS — Y990 Civilian activity done for income or pay: Secondary | ICD-10-CM | POA: Diagnosis not present

## 2023-02-14 DIAGNOSIS — W228XXA Striking against or struck by other objects, initial encounter: Secondary | ICD-10-CM | POA: Insufficient documentation

## 2023-02-14 DIAGNOSIS — S0990XA Unspecified injury of head, initial encounter: Secondary | ICD-10-CM

## 2023-02-14 DIAGNOSIS — S060X1A Concussion with loss of consciousness of 30 minutes or less, initial encounter: Secondary | ICD-10-CM | POA: Insufficient documentation

## 2023-02-14 DIAGNOSIS — S01112A Laceration without foreign body of left eyelid and periocular area, initial encounter: Secondary | ICD-10-CM | POA: Insufficient documentation

## 2023-02-14 DIAGNOSIS — R202 Paresthesia of skin: Secondary | ICD-10-CM | POA: Diagnosis not present

## 2023-02-14 LAB — CBC WITH DIFFERENTIAL/PLATELET
Abs Immature Granulocytes: 0.01 10*3/uL (ref 0.00–0.07)
Basophils Absolute: 0 10*3/uL (ref 0.0–0.1)
Basophils Relative: 1 %
Eosinophils Absolute: 0 10*3/uL (ref 0.0–0.5)
Eosinophils Relative: 1 %
HCT: 44.8 % (ref 39.0–52.0)
Hemoglobin: 15.9 g/dL (ref 13.0–17.0)
Immature Granulocytes: 0 %
Lymphocytes Relative: 25 %
Lymphs Abs: 1.2 10*3/uL (ref 0.7–4.0)
MCH: 32.9 pg (ref 26.0–34.0)
MCHC: 35.5 g/dL (ref 30.0–36.0)
MCV: 92.6 fL (ref 80.0–100.0)
Monocytes Absolute: 0.4 10*3/uL (ref 0.1–1.0)
Monocytes Relative: 8 %
Neutro Abs: 3.2 10*3/uL (ref 1.7–7.7)
Neutrophils Relative %: 65 %
Platelets: 190 10*3/uL (ref 150–400)
RBC: 4.84 MIL/uL (ref 4.22–5.81)
RDW: 11.2 % — ABNORMAL LOW (ref 11.5–15.5)
WBC: 4.9 10*3/uL (ref 4.0–10.5)
nRBC: 0 % (ref 0.0–0.2)

## 2023-02-14 LAB — COMPREHENSIVE METABOLIC PANEL
ALT: 52 U/L — ABNORMAL HIGH (ref 0–44)
AST: 29 U/L (ref 15–41)
Albumin: 3.9 g/dL (ref 3.5–5.0)
Alkaline Phosphatase: 62 U/L (ref 38–126)
Anion gap: 11 (ref 5–15)
BUN: 18 mg/dL (ref 6–20)
CO2: 22 mmol/L (ref 22–32)
Calcium: 9.5 mg/dL (ref 8.9–10.3)
Chloride: 103 mmol/L (ref 98–111)
Creatinine, Ser: 0.79 mg/dL (ref 0.61–1.24)
GFR, Estimated: >60 mL/min (ref >60)
Glucose, Bld: 107 mg/dL — ABNORMAL HIGH (ref 70–99)
Potassium: 3.9 mmol/L (ref 3.5–5.1)
Sodium: 136 mmol/L (ref 135–145)
Total Bilirubin: 0.9 mg/dL (ref 0.0–1.2)
Total Protein: 6.3 g/dL — ABNORMAL LOW (ref 6.5–8.1)

## 2023-02-14 LAB — PROTIME-INR
INR: 1 (ref 0.8–1.2)
Prothrombin Time: 13.2 s (ref 11.4–15.2)

## 2023-02-14 LAB — I-STAT CHEM 8, ED
BUN: 19 mg/dL (ref 6–20)
Calcium, Ion: 1.13 mmol/L — ABNORMAL LOW (ref 1.15–1.40)
Chloride: 105 mmol/L (ref 98–111)
Creatinine, Ser: 0.8 mg/dL (ref 0.61–1.24)
Glucose, Bld: 103 mg/dL — ABNORMAL HIGH (ref 70–99)
HCT: 45 % (ref 39.0–52.0)
Hemoglobin: 15.3 g/dL (ref 13.0–17.0)
Potassium: 3.9 mmol/L (ref 3.5–5.1)
Sodium: 138 mmol/L (ref 135–145)
TCO2: 22 mmol/L (ref 22–32)

## 2023-02-14 LAB — RAPID URINE DRUG SCREEN, HOSP PERFORMED
Amphetamines: NOT DETECTED
Barbiturates: NOT DETECTED
Benzodiazepines: NOT DETECTED
Cocaine: NOT DETECTED
Opiates: NOT DETECTED
Tetrahydrocannabinol: NOT DETECTED

## 2023-02-14 LAB — LIPASE, BLOOD: Lipase: 39 U/L (ref 11–51)

## 2023-02-14 MED ORDER — IBUPROFEN 600 MG PO TABS: 600.0000 mg | ORAL_TABLET | Freq: Four times a day (QID) | ORAL | 0 refills | Status: AC | PRN

## 2023-02-14 MED ORDER — ONDANSETRON 4 MG PO TBDP: 4.0000 mg | ORAL_TABLET | Freq: Three times a day (TID) | ORAL | 0 refills | Status: DC | PRN

## 2023-02-14 MED ORDER — SODIUM CHLORIDE 0.9 % IV BOLUS
500.0000 mL | Freq: Once | INTRAVENOUS | Status: AC
  Administered 2023-02-14: 500 mL via INTRAVENOUS

## 2023-02-14 MED ORDER — LIDOCAINE-EPINEPHRINE (PF) 2 %-1:200000 IJ SOLN
10.0000 mL | Freq: Once | INTRAMUSCULAR | Status: AC
  Administered 2023-02-14: 10 mL
  Filled 2023-02-14: qty 20

## 2023-02-14 MED ORDER — KETOROLAC TROMETHAMINE 30 MG/ML IJ SOLN
30.0000 mg | Freq: Once | INTRAMUSCULAR | Status: AC
  Administered 2023-02-14: 30 mg via INTRAVENOUS
  Filled 2023-02-14: qty 1

## 2023-02-14 MED ORDER — IOHEXOL 350 MG/ML SOLN
75.0000 mL | Freq: Once | INTRAVENOUS | Status: AC | PRN
  Administered 2023-02-14: 75 mL via INTRAVENOUS

## 2023-02-14 NOTE — ED Notes (Signed)
Went to MRI 
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
## 2023-02-14 NOTE — ED Notes (Signed)
Brought straight back to triage for evaluation .
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
## 2023-02-14 NOTE — Discharge Instructions (Signed)
 You were seen in the Emergency Department (ED) today for a head injury.  Based on your evaluation, you may have sustained a concussion (or bruise) to your brain.  If you had a CT scan done, it did not show any evidence of serious injury or bleeding.    Symptoms to expect from a concussion include nausea, mild to moderate headache, difficulty concentrating or sleeping, and mild lightheadedness.  These symptoms should improve over the next few days to weeks, but it may take many weeks before you feel back to normal.  Return to the emergency department or follow-up with your primary care doctor if your symptoms are not improving over this time.  Signs of a more serious head injury include vomiting, severe headache, excessive sleepiness or confusion, and weakness or numbness in your face, arms or legs.  Return immediately to the Emergency Department if you experience any of these more concerning symptoms.    Rest, avoid strenuous physical or mental activity, and avoid activities that could potentially result in another head injury until all your symptoms from this head injury are completely resolved for at least 2-3 weeks.  If you participate in sports, get cleared by your doctor or trainer before returning to play.  You may take ibuprofen or acetaminophen over the counter according to label instructions for mild headache or scalp soreness.

## 2023-02-14 NOTE — Progress Notes (Signed)
Orthopedic Tech Progress Note Patient Details:  Roger Gonzalez 1965-10-18 [id number] Level 2 Trauma. Not needed Patient ID: Roger Gonzalez, male   DOB: [birth year], 58 y.o.   MRN: [id number]  Roger Gonzalez 02/14/2023, 11:20 AM

## 2023-02-14 NOTE — ED Triage Notes (Signed)
Pt. Had a piece of metal to hit me on the left side of eyebrow, LOC , having leg numbness, headache, back pain and can not stop shaking.

## 2023-02-14 NOTE — TOC CAGE-AID Note (Signed)
Transition of Care [facility]) - CAGE-AID Screening  Patient Details  Name: TORRY GRADY MRN: [id number] Date of Birth: Jan 27, 1965  Clinical Narrative:  Patient to ED after an incident at work where he was hit by a piece of equipment. Patient does occasionally drink but very little, 1-2x/month. Patient denies any drug use or smoking. Patient asked if substance abuse resources are needed, he denies need at this time.  CAGE-AID Screening:   Have You Ever Felt You Ought to Cut Down on Your Drinking or Drug Use?: No Have People Annoyed You By Critizing Your Drinking Or Drug Use?: No Have You Felt Bad Or Guilty About Your Drinking Or Drug Use?: No Have You Ever Had a Drink or Used Drugs First Thing In The Morning to Steady Your Nerves or to Get Rid of a Hangover?: No CAGE-AID Score: 0  Substance Abuse Education Offered: Yes

## 2023-02-19 ENCOUNTER — Emergency Department (HOSPITAL_COMMUNITY): Payer: Managed Care, Other (non HMO)

## 2023-02-19 ENCOUNTER — Encounter (HOSPITAL_COMMUNITY): Payer: Self-pay | Admitting: Emergency Medicine

## 2023-02-19 ENCOUNTER — Emergency Department (HOSPITAL_COMMUNITY)
Admission: EM | Admit: 2023-02-19 | Discharge: 2023-02-19 | Disposition: A | Discharge status: Home / Self Care | Payer: Self-pay | Attending: Emergency Medicine | Admitting: Emergency Medicine

## 2023-02-19 ENCOUNTER — Other Ambulatory Visit: Payer: Self-pay

## 2023-02-19 DIAGNOSIS — Z79899 Other long term (current) drug therapy: Secondary | ICD-10-CM | POA: Insufficient documentation

## 2023-02-19 DIAGNOSIS — S060X9D Concussion with loss of consciousness of unspecified duration, subsequent encounter: Secondary | ICD-10-CM | POA: Insufficient documentation

## 2023-02-19 DIAGNOSIS — Y990 Civilian activity done for income or pay: Secondary | ICD-10-CM | POA: Insufficient documentation

## 2023-02-19 DIAGNOSIS — W228XXD Striking against or struck by other objects, subsequent encounter: Secondary | ICD-10-CM | POA: Insufficient documentation

## 2023-02-19 LAB — CBG MONITORING, ED: Glucose-Capillary: 81 mg/dL (ref 70–99)

## 2023-02-19 MED ORDER — ACETAMINOPHEN 500 MG PO TABS
1000.0000 mg | ORAL_TABLET | Freq: Once | ORAL | Status: AC
  Administered 2023-02-19: 1000 mg via ORAL
  Filled 2023-02-19: qty 2

## 2023-02-19 NOTE — ED Provider Notes (Signed)
[facility] AT [facility] Provider Note   CSN: [id number] Arrival date & time: 02/19/23  1427     History  Chief Complaint  Patient presents with   Post Concussive Sx     Roger Gonzalez is a 58 y.o. male.  Patient is a 58 year old male presenting for difficulty with word finding, stuttering, persistent headache, and left-sided facial pain.  History pertinent for head trauma in which patient's head was struck at work resulting in a laceration over the right supraorbital ridge on 02/14/2023.  This patient had negative CT head and CT spine at that time.  He had negative MRI.  Symptoms have been persistent.  Denies any repeat falls or head trauma.  The history is provided by the patient. No language interpreter was used.       Home Medications Prior to Admission medications   Medication Sig Start Date End Date Taking? Authorizing Provider  atorvastatin (LIPITOR) 10 MG tablet Take 1 tablet (10 mg total) by mouth daily. Patient not taking: Reported on 02/14/2023 10/26/22   Christen Butter, NP  celecoxib (CELEBREX) 200 MG capsule One to 2 tablets by mouth daily as needed for pain. 02/08/23   Monica Becton, MD  ibuprofen (ADVIL) 600 MG tablet Take 1 tablet (600 mg total) by mouth every 6 (six) hours as needed for headache. 02/14/23   Long, Arlyss Repress, MD  lamoTRIgine (LAMICTAL) 200 MG tablet Take 200 mg by mouth daily. 08/10/22   [provider]  losartan (COZAAR) 50 MG tablet Take 1 tablet (50 mg total) by mouth daily. 11/09/22   [name], DO  lumateperone tosylate (CAPLYTA) 42 MG capsule Take 42 mg by mouth daily.    [provider]  Multiple Vitamins-Minerals (ONE-A-DAY MENS 50+) TABS Take 1 tablet by mouth daily.    [provider]  ondansetron (ZOFRAN-ODT) 4 MG disintegrating tablet Take 1 tablet (4 mg total) by mouth every 8 (eight) hours as needed. 02/14/23   Long, Arlyss Repress, MD  pantoprazole (PROTONIX) 40 MG tablet TAKE  1 TABLET BY MOUTH EVERY DAY 09/07/22   [name], DO  tadalafil (CIALIS) 10 MG tablet Take 5 mg by mouth daily as needed for erectile dysfunction.    [provider]  traZODone (DESYREL) 50 MG tablet Take 50 mg by mouth at bedtime. 02/12/23   [provider]  valACYclovir (VALTREX) 1000 MG tablet TAKE 1 TABLET BY MOUTH EVERY DAY 09/07/22   [name], DO      Allergies    Barbiturates, Other, Penicillins, Wasabi (wasabia [place]), and Sulfamethoxazole-trimethoprim    Review of Systems   Review of Systems  Constitutional:  Negative for chills and fever.  HENT:  Negative for ear pain and sore throat.   Eyes:  Negative for pain and visual disturbance.  Respiratory:  Negative for cough and shortness of breath.   Cardiovascular:  Negative for chest pain and palpitations.  Gastrointestinal:  Negative for abdominal pain and vomiting.  Genitourinary:  Negative for dysuria and hematuria.  Musculoskeletal:  Negative for arthralgias and back pain.  Skin:  Negative for color change and rash.  Neurological:  Positive for speech difficulty and headaches. Negative for seizures and syncope.  All other systems reviewed and are negative.   Physical Exam Updated Vital Signs BP (!) 131/92   Pulse 62   Temp 98 F (36.7 C)   Resp (!) 26   Wt 85.3 kg   SpO2 100%   BMI 26.98 kg/m  Physical Exam Vitals and nursing note reviewed.  Constitutional:      General: He is not in acute distress.    Appearance: He is well-developed.  HENT:     Head: Normocephalic and atraumatic.  Eyes:     General: Lids are normal. Vision grossly intact.     Conjunctiva/sclera: Conjunctivae normal.     Pupils: Pupils are equal, round, and reactive to light.  Cardiovascular:     Rate and Rhythm: Normal rate and regular rhythm.     Heart sounds: No murmur heard. Pulmonary:     Effort: Pulmonary effort is normal. No respiratory distress.     Breath sounds: Normal breath sounds.  Abdominal:      Palpations: Abdomen is soft.     Tenderness: There is no abdominal tenderness.  Musculoskeletal:        General: No swelling.     Cervical back: Neck supple.  Skin:    General: Skin is warm and dry.     Capillary Refill: Capillary refill takes less than 2 seconds.  Neurological:     Mental Status: He is alert and oriented to person, place, and time.     GCS: GCS eye subscore is 4. GCS verbal subscore is 5. GCS motor subscore is 6.     Cranial Nerves: Cranial nerves 2-12 are intact.     Sensory: Sensation is intact.     Motor: Motor function is intact.     Coordination: Finger-Nose-Finger Test abnormal.     Gait: Gait is intact.  Psychiatric:        Mood and Affect: Mood normal.     ED Results / Procedures / Treatments   Labs (all labs ordered are listed, but only abnormal results are displayed) Labs Reviewed  CBG MONITORING, ED    EKG None  Radiology CT Head Wo Contrast Result Date: 02/19/2023 CLINICAL DATA:  Ataxia, head trauma limb ataxia, confusion, likely concussion from previous head injury r/o other path EXAM: CT HEAD WITHOUT CONTRAST TECHNIQUE: Contiguous axial images were obtained from the base of the skull through the vertex without intravenous contrast. RADIATION DOSE REDUCTION: This exam was performed according to the departmental dose-optimization program which includes automated exposure control, adjustment of the mA and/or kV according to patient size and/or use of iterative reconstruction technique. COMPARISON:  MRI head 02/14/2023 FINDINGS: Brain: No evidence of large-territorial acute infarction. No parenchymal hemorrhage. No mass lesion. No extra-axial collection. No mass effect or midline shift. No hydrocephalus. Basilar cisterns are patent. Vascular: No hyperdense vessel. Skull: No acute fracture or focal lesion. Sinuses/Orbits: Paranasal sinuses and mastoid air cells are clear. The orbits are unremarkable. Other: None. IMPRESSION: No acute intracranial  abnormality. Electronically Signed   By: Tish Frederickson M.D.   On: 02/19/2023 18:43    Procedures Procedures    Medications Ordered in ED Medications  acetaminophen (TYLENOL) tablet 1,000 mg (1,000 mg Oral Given 02/19/23 1538)    ED Course/ Medical Decision Making/ A&P                                 Medical Decision Making Amount and/or Complexity of Data Reviewed Radiology: ordered.   58 year old male presenting for difficulty with word finding, stuttering, persistent headache, and left-sided facial pain.  Patient is alert and oriented x 3, no acute distress, afebrile, so vital signs.  He does have finger-to-nose ataxia but otherwise physical exam is unimpressive.  Repeat CT  head demonstrates no acute process.  Symptoms likely secondary to concussion.  Recommend close follow-up with neurology if symptoms do not improve in the next 1 to 2 weeks.  Patient in no distress and overall condition improved here in the ED. Detailed discussions were had with the patient regarding current findings, and need for close f/u with PCP or on call doctor. The patient has been instructed to return immediately if the symptoms worsen in any way for re-evaluation. Patient verbalized understanding and is in agreement with current care plan. All questions answered prior to discharge.         Final Clinical Impression(s) / ED Diagnoses Final diagnoses:  Concussion with loss of consciousness, subsequent encounter    Rx / DC Orders ED Discharge Orders     None         Franne Forts, DO 02/19/23 2013

## 2023-02-19 NOTE — ED Triage Notes (Addendum)
Pt presents from UC for postconcussive sx, UC directed him to ER Hit his head 1/23 with large metal object with positive LOC, see previous ER note.  Endorses HA, mood swings, trouble concentrating, sensation of fullness behind eyes, occipital head pain Denies N/V , additional syncopal episode Was evaluated previously for this in ER with CT scan.  [facility]: ambulatory, A&Ox4 , stroke screen neg, 99CBG, 140/60, 68bpm, 98% RA, 16RR

## 2023-02-19 NOTE — ED Provider Triage Note (Signed)
Emergency Medicine Provider Triage Evaluation Note  Roger Gonzalez , a 58 y.o. male  was evaluated in triage.  Pt complains of post concussive symptoms.  The patient was seen in the emergency department on 02/14/2023 after he was struck in the head at work.  He went CT imaging of the head at the time which was negative.  He went MRI of the brain and cervical spine which was negative for acute intracranial abnormality.  Laceration repaired.  He is here due to persistent postconcussive symptoms.  Endorses word finding, stuttering of speech, persistent headache, severe left-sided facial pain.  No new falls or trauma.  He is requesting referral for a neurologist.  He denies any focal weakness or numbness.  Review of Systems  Positive: Headache, speech difficulty, facial pain Negative: Weakness, numbness  Physical Exam  BP (!) 131/92   Pulse 62   Temp 98 F (36.7 C)   Resp (!) 26   Wt 85.3 kg   SpO2 100%   BMI 26.98 kg/m  Gen:   Awake, no distress HEENT: Lac repair with sutures in place, healing well. Mild periorbital hematoma Neuro:  Speech stuttering, intermittent word finding, does not fully track on CN exam, no clear nystagmus. Strength exam appears to be effort dependent, no clear weakness, 5/5 strength. Intermittent dysmetria noted.  Resp:  Normal effort, lungs CTAB MSK:   Moves extremities without difficulty    Medical Decision Making  Medically screening exam initiated at 3:13 PM.  Appropriate orders placed.  MATEUSZ NEILAN was informed that the remainder of the evaluation will be completed by another provider, this initial triage assessment does not replace that evaluation, and the importance of remaining in the ED until their evaluation is complete.  Pt presenting with post concussive symptoms. Extensive ED workup previously. Pt requires screening exam. CBG ordered.    Ernie Avena, MD 02/19/23 [phone]

## 2023-03-21 ENCOUNTER — Ambulatory Visit: Payer: Managed Care, Other (non HMO) | Admitting: Family Medicine

## 2023-03-21 ENCOUNTER — Ambulatory Visit: Payer: Managed Care, Other (non HMO) | Admitting: Sports Medicine

## 2023-03-25 ENCOUNTER — Ambulatory Visit: Payer: Self-pay | Admitting: Family Medicine

## 2023-03-25 NOTE — Telephone Encounter (Signed)
  Chief Complaint: Headache Symptoms: Headache, eye pain, teeth pain, head fog. Frequency: Constant Pertinent Negatives: Patient denies Vision changes, numbness/weakness, facial droop,  Disposition: [] ED /[] Urgent Care (no appt availability in office) / [x] Appointment(In office/virtual)/ []  [facility]/ [] Home Care/ [] Refused Recommended Disposition /[]  Mobile Bus/ []  Follow-up with PCP Additional Notes: Patient called with complaints of returning symptoms post concussion. Patient states he had a grade 2 concussion in January and in the past week symptoms have return with "head fog," headache, left eye pain, and teeth pain. Patient states that symptoms usually come around evening time and are moderate, with patient having to use tylenol a few times that relieved symptoms temporarily. Patient states he has eye pain and teeth pain that is only on the left side. Patient denies reinjury to head, numbness/weakness, fever, vision changes, facial droop. Patient advised by this RN to be seen within 3 days per protocol, to which patient was agreeable. Appt scheduled. Patient advised by this RN to call back with worsening symptoms. Patient verbalized understanding.  Copied from CRM [id number]. Topic: Clinical - Red Word Triage >> Mar 25, 2023  4:16 PM Nila Nephew wrote: Red Word that prompted transfer to Nurse Triage: concussion at end of january -still having symptoms - tender wound, teeth are hurting, eye on left side hurting, headache consistently, feeling different in the head Reason for Disposition  [1] MILD-MODERATE headache AND [2] present > 72 hours  Answer Assessment - Initial Assessment Questions 1. LOCATION: "Where does it hurt?"      Left side 2. ONSET: "When did the headache start?" (Minutes, hours or days)      Several weeks 3. PATTERN: "Does the pain come and go, or has it been constant since it started?"     Usually at night/evening  4. SEVERITY: "How bad is the pain?" and  "What does it keep you from doing?"  (e.g., Scale 1-10; mild, moderate, or severe)   - MILD (1-3): doesn't interfere with normal activities    - MODERATE (4-7): interferes with normal activities or awakens from sleep    - SEVERE (8-10): excruciating pain, unable to do any normal activities        Moderate 5. RECURRENT SYMPTOM: "Have you ever had headaches before?" If Yes, ask: "When was the last time?" and "What happened that time?"      6. CAUSE: "What do you think is causing the headache?"     Concussion in Jan 2025 7. MIGRAINE: "Have you been diagnosed with migraine headaches?" If Yes, ask: "Is this headache similar?"      Denies 8. HEAD INJURY: "Has there been any recent injury to the head?"      Jan 2025 9. OTHER SYMPTOMS: "Do you have any other symptoms?" (fever, stiff neck, eye pain, sore throat, cold symptoms)     Eye pain, teeth pain, "head fog"  Tylenol - only had to take it a few times, helps, pain returns.  Protocols used: [facility]

## 2023-03-27 ENCOUNTER — Ambulatory Visit: Admitting: Family Medicine

## 2023-05-01 ENCOUNTER — Other Ambulatory Visit: Payer: Self-pay | Admitting: Family Medicine

## 2023-05-03 ENCOUNTER — Telehealth: Payer: Self-pay | Admitting: Family Medicine

## 2023-05-03 ENCOUNTER — Other Ambulatory Visit: Payer: Self-pay | Admitting: Family Medicine

## 2023-05-03 DIAGNOSIS — N529 Male erectile dysfunction, unspecified: Secondary | ICD-10-CM

## 2023-05-03 NOTE — Telephone Encounter (Signed)
 Copied from CRM [id number]. Topic: Referral - Request for Referral >> May 03, 2023 10:49 AM Corin V wrote: Did the patient discuss referral with their provider in the last year? Yes (If No - schedule appointment) (If Yes - send message)  Appointment offered? No- did not want one  Type of order/referral and detailed reason for visit: Urologist, for ED  Preference of office, provider, location: [facility] (preferred) or [facility]  If referral order, have you been seen by this specialty before? No (If Yes, this issue or another issue? When? Where?  Can we respond through MyChart? No

## 2023-05-03 NOTE — Telephone Encounter (Signed)
 Patient is requesting a referral to Urologist for ED. He states this was discussed a year ago(this message came over as a CRM, I did something wrong because I don't see it now)

## 2023-05-29 ENCOUNTER — Ambulatory Visit: Payer: Self-pay

## 2023-05-29 NOTE — Telephone Encounter (Signed)
 Copied from CRM [id number]. Topic: Clinical - Red Word Triage >> May 29, 2023  3:51 PM Suzette B wrote: [facility] that prompted transfer to Nurse Triage: Panic Attack for over hour  Chief Complaint: panic attack; chest discomfort Symptoms: see above Frequency: comes and goes Pertinent Negatives: Patient denies NA Disposition: [x] ED /[] Urgent Care (no appt availability in office) / [] Appointment(In office/virtual)/ []  [facility]/ [] Home Care/ [] Refused Recommended Disposition /[] [facility]/ []  Follow-up with PCP Additional Notes: instructed to go to ER; care advice given, denies questions; pcp office updated.   Reason for Disposition  [1] Lightheadedness or dizziness AND [2] persists > 10 minutes AND [3] not relieved by reassurance provided by triager  Answer Assessment - Initial Assessment Questions 1. CONCERN: "Did anything happen that prompted you to call today?"      Panic attack for over an hour 2. ANXIETY SYMPTOMS: "Can you describe how you (your loved one; patient) have been feeling?" (e.g., tense, restless, panicky, anxious, keyed up, overwhelmed, sense of impending doom).      Speech is fast, anxious 3. ONSET: "How long have you been feeling this way?" (e.g., hours, days, weeks)     Comes and goes 4. SEVERITY: "How would you rate the level of anxiety?" (e.g., 0 - 10; or mild, moderate, severe).     moderate 5. FUNCTIONAL IMPAIRMENT: "How have these feelings affected your ability to do daily activities?" "Have you had more difficulty than usual doing your normal daily activities?" (e.g., getting better, same, worse; self-care, school, work, interactions)     States chest discomfort 6. HISTORY: "Have you felt this way before?" "Have you ever been diagnosed with an anxiety problem in the past?" (e.g., generalized anxiety disorder, panic attacks, PTSD). If Yes, ask: "How was this problem treated?" (e.g., medicines, counseling, etc.)     yes 7. RISK OF HARM -  SUICIDAL IDEATION: "Do you ever have thoughts of hurting or killing yourself?" If Yes, ask:  "Do you have these feelings now?" "Do you have a plan on how you would do this?"     no 8. TREATMENT:  "What has been done so far to treat this anxiety?" (e.g., medicines, relaxation strategies). "What has helped?"     Took inderal  9. TREATMENT - THERAPIST: "Do you have a counselor or therapist? Name?"     yes 10. POTENTIAL TRIGGERS: "Do you drink caffeinated beverages (e.g., coffee, colas, teas), and how much daily?" "Do you drink alcohol or use any drugs?" "Have you started any new medicines recently?"       no 11. PATIENT SUPPORT: "Who is with you now?" "Who do you live with?" "Do you have family or friends who you can talk to?"        At work 12. OTHER SYMPTOMS: "Do you have any other symptoms?" (e.g., feeling depressed, trouble concentrating, trouble sleeping, trouble breathing, palpitations or fast heartbeat, chest pain, sweating, nausea, or diarrhea)       Chest discomfort, shakey 13. PREGNANCY: "Is there any chance you are pregnant?" "When was your last menstrual period?"       na  Protocols used: Anxiety and Panic Attack-A-AH

## 2023-05-29 NOTE — Telephone Encounter (Signed)
 FYI

## 2023-06-05 ENCOUNTER — Encounter: Payer: Self-pay | Admitting: Urology

## 2023-06-05 ENCOUNTER — Ambulatory Visit: Admitting: Urology

## 2023-06-05 VITALS — BP 132/81 | HR 69 | Ht 70.0 in | Wt 192.0 lb

## 2023-06-05 DIAGNOSIS — N5201 Erectile dysfunction due to arterial insufficiency: Secondary | ICD-10-CM | POA: Diagnosis not present

## 2023-06-05 NOTE — Progress Notes (Signed)
 Chief Complaint: No chief complaint on file.   History of Present Illness:  Roger Gonzalez is a 58 y.o. male who is seen in consultation from Adela Holter, DO for evaluation of erectile dysfunction.  He first noted difficulty maintaining erections about 8 years ago.  At that time he was placed on sildenafil .  He eventually worked up to 150 mg doses at a time.  Despite that, he still has problems maintaining erections.  He has also tried 10 to 20 mg of tadalafil .  It makes him feel ill that he uses well is causing numbness in his legs.  He does not currently use tadalafil .  Most of the time he has moderate success with supraphysiologic doses of the sildenafil .  He would like to consider other therapy.   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   Qualifier: Diagnosis of  By: Allene Ards      Past Surgical History:  Past Surgical History:  Procedure Laterality Date   TONSILLECTOMY      Allergies:  Allergies  Allergen Reactions   Barbiturates Other (See Comments)    Hyperactivity, depression   Other Other (See Comments)    Steroids cause mania   Penicillins Hives and Rash    Has patient had a PCN reaction causing immediate rash, facial/tongue/throat swelling, SOB or lightheadedness with hypotension: Yes Has patient had a PCN reaction causing severe rash involving mucus membranes or skin necrosis: No Has patient had a PCN reaction that required hospitalization No Has patient had a PCN reaction occurring within the last 10 years: Yes If all of the above answers are "NO", then may proceed with Cephalosporin use. Has patient had a PCN reaction causing immediate rash, facial/tongue/throat swelling, SOB or lightheadedness with hypotension: Yes Has patient had a PCN reaction causing severe rash involving  mucus membranes or skin necrosis: No Has patient had a PCN reaction that required hospitalization No Has patient had a PCN reaction occurring within the last 10 years: Yes If all of the above answers are "NO", then may proceed with Cephalosporin use.   [place] ([place] Eveline Hipps) Hives   Sulfamethoxazole -Trimethoprim  Diarrhea    diarrhea    Family History:  Family History  Problem Relation Age of Onset   Hypertension Mother    Bipolar disorder Father     Social History:  Social History   Tobacco Use   Smoking status: Former   Smokeless tobacco: Former    Quit date: 05/05/2000  Substance Use Topics   Alcohol use: No    Alcohol/week: 0.0 standard drinks of alcohol   Drug use: No    Review of symptoms:  Constitutional:  Negative for unexplained weight loss, night sweats, fever, chills ENT:  Negative for nose bleeds, sinus pain, painful swallowing CV:  Negative for chest pain, shortness of breath, exercise intolerance, palpitations, loss of consciousness Resp:  Negative for cough, wheezing, shortness of breath GI:  Negative for nausea, vomiting, diarrhea, bloody stools GU:  Positives noted in HPI; otherwise negative for gross hematuria, dysuria, urinary incontinence Neuro:  Negative for seizures, poor balance, limb weakness, slurred speech Psych:  Negative for lack of energy, depression, anxiety Endocrine:  Negative for polydipsia, polyuria, symptoms of hypoglycemia (dizziness, hunger, sweating) Hematologic:  Negative for anemia, purpura, petechia, prolonged or excessive bleeding, use of anticoagulants  Allergic:  Negative for difficulty  breathing or choking as a result of exposure to anything; no shellfish allergy; no allergic response (rash/itch) to materials, foods  Physical exam: There were no vitals taken for this visit. GENERAL APPEARANCE:  Well appearing, well developed, well nourished, NAD HEENT: Atraumatic, Normocephalic. NECK: Normal appearance LUNGS: Normal  inspiratory and expiratory excursion HEART: Regular Rate ABDOMEN: No inguinal hernias. GU: Phallus normal, no lesions. Scrotal skin normal. Testicles/epididymal structures normal. Meatus normal. N EXTREMITIES: Moves all extremities well.  Without clubbing, cyanosis, or edema. NEUROLOGIC:  Alert and oriented x 3, normal gait, CN II-XII grossly intact.  MENTAL STATUS:  Appropriate. SKIN:  Warm, dry and intact.    Results: No results found for this or any previous visit (from the past 24 hours).  I have reviewed referring/prior physicians notes  I have reviewed urinalysis  Assessment: ED, progressive   Plan: I discussed further management with him-vacuum pumps, prostaglandin injections.  He is okay with going ahead with training with prostaglandin.  I prescribed 20 mcg/milliliter doses.  He will bring in one of the vials in the near future for training.

## 2023-06-09 ENCOUNTER — Other Ambulatory Visit: Payer: Self-pay | Admitting: Family Medicine

## 2023-06-24 ENCOUNTER — Other Ambulatory Visit: Payer: Self-pay | Admitting: Family Medicine

## 2023-06-24 DIAGNOSIS — K219 Gastro-esophageal reflux disease without esophagitis: Secondary | ICD-10-CM

## 2023-08-01 ENCOUNTER — Telehealth: Payer: Self-pay

## 2023-08-01 NOTE — Telephone Encounter (Signed)
 Copied from CRM [id number]. Topic: General - Other >> Aug 01, 2023  9:31 AM Susanna ORN wrote: Reason for CRM: Patient wants a nurse to give him a call. States that Dr. ONEIDA diagnosed him with degenerative back issues and he wanted to ask about arthritis and some recommendations. Please give him a call back at [phone].

## 2023-08-01 NOTE — Telephone Encounter (Signed)
 Spoke with patient . He would like to ask Dr. Curtis what collagen he would recommend for treatment of arthritis?

## 2023-08-02 NOTE — Telephone Encounter (Signed)
 I have difficulty recommending supplements that dont have good evidence for efficacy, collagen is an OTC supplement that's not proven.  He was supposed to reach out after 6 weeks if not better to discuss MRI.  Until then we could try oral analgesics such as tylenol  and naproxen.

## 2023-08-05 NOTE — Telephone Encounter (Signed)
 Patient informed of message by DR. Curtis

## 2023-08-28 ENCOUNTER — Other Ambulatory Visit: Payer: Self-pay | Admitting: Family Medicine

## 2023-08-28 DIAGNOSIS — I10 Essential (primary) hypertension: Secondary | ICD-10-CM

## 2023-08-29 NOTE — Telephone Encounter (Signed)
 Pls contact pt to schedule HTN & med refill appt with Dr. Alvia. Fasting labs due. Sending 30 day med refill. No additional. Thanks

## 2023-09-10 ENCOUNTER — Ambulatory Visit: Payer: Self-pay

## 2023-09-10 ENCOUNTER — Telehealth: Payer: Self-pay | Admitting: Family Medicine

## 2023-09-10 NOTE — Telephone Encounter (Signed)
 This RN reviewed rationale for why patient needs evaluation today in office or in the emergency department. Patient refused x multiple attempts. Denies currently experiencing symptoms.  Patient stated he can come in tomorrow. Patient then stated he will have to call back after he speaks with his father.  FYI Only or Action Required?: FYI only for provider.  Patient was last seen in primary care on 02/08/2023 by Curtis Debby PARAS, MD.  Called Nurse Triage reporting Triage.  Symptoms began today.  Interventions attempted: Nothing.  Symptoms are: completely resolved.  Triage Disposition: See Physician Within 24 Hours  Patient/caregiver understands and will follow disposition?: No, refuses disposition Reason for Disposition  [1] MODERATE dizziness (e.g., vertigo; feels very unsteady, interferes with normal activities) AND [2] has NOT been evaluated by doctor (or NP/PA) for this  [1] NO dizziness now AND [2] one or more stroke risk factors (i.e., hypertension, diabetes, prior stroke/TIA/heart attack)  Answer Assessment - Initial Assessment Questions DESCRIPTION: Describe your dizziness.     Dizzy with intermittent nausea   VERTIGO: Do you feel like either you or the room is spinning or tilting?      Denies   ONSET:  When did the dizziness begin?     Today 09/10/23, but states it has ocassionally happened before, believes it to be r/t a previous concussion   AGGRAVATING FACTORS: Does anything make it worse? (e.g., standing, change in head position)     States random, not position related   OTHER SYMPTOMS: Do you have any other symptoms? (e.g., earache, headache, numbness, tinnitus, vomiting, weakness)     Intermittent headache  Protocols used: Dizziness - Vertigo-A-AH Copied from CRM [id number]. Topic: Clinical - Red Word Triage >> Sep 10, 2023  2:10 PM Kevelyn M wrote: Red Word that prompted transfer to Nurse Triage: Has been having spells where he gets dizzy and  nauseated for the last week and a half.

## 2023-09-10 NOTE — Telephone Encounter (Signed)
 Copied from CRM #[id number]. Topic: Appointments - Transfer of Care >> Sep 10, 2023  2:09 PM Kevelyn M wrote: Pt is requesting to transfer FROM: Roger Gonzalez Pt is requesting to transfer TO: Roger Gonzalez  It is the responsibility of the team the patient would like to transfer to (Dr. Brownie) to reach out to the patient if for any reason this transfer is not acceptable.

## 2023-09-17 ENCOUNTER — Encounter: Payer: Self-pay | Admitting: Family Medicine

## 2023-09-17 ENCOUNTER — Ambulatory Visit: Admitting: Family Medicine

## 2023-09-17 VITALS — BP 103/64 | HR 63 | Temp 98.2°F | Ht 70.0 in | Wt 191.0 lb

## 2023-09-17 DIAGNOSIS — I10 Essential (primary) hypertension: Secondary | ICD-10-CM

## 2023-09-17 DIAGNOSIS — Z7689 Persons encountering health services in other specified circumstances: Secondary | ICD-10-CM

## 2023-09-17 DIAGNOSIS — K219 Gastro-esophageal reflux disease without esophagitis: Secondary | ICD-10-CM

## 2023-09-17 DIAGNOSIS — F3160 Bipolar disorder, current episode mixed, unspecified: Secondary | ICD-10-CM

## 2023-09-17 DIAGNOSIS — B009 Herpesviral infection, unspecified: Secondary | ICD-10-CM | POA: Diagnosis not present

## 2023-09-17 NOTE — Assessment & Plan Note (Signed)
 Taking losartan  50 mg daily. Well controlled. No chest pain or shortness of breath. Watches sodium and strenuous job. No refills needed.

## 2023-09-17 NOTE — Assessment & Plan Note (Signed)
 Pantoprazole  40 mg daily. Symptoms are well controlled. No refills needed.

## 2023-09-17 NOTE — Assessment & Plan Note (Signed)
 Taking Lamictal  200 mg daily. Caplyta 42 mg daily Propranolol  10 mg as needed. Managed by psychiatry.

## 2023-09-17 NOTE — Assessment & Plan Note (Signed)
 Valtrex  1000 mg daily. Has not had outbreak in some time.

## 2023-09-17 NOTE — Progress Notes (Signed)
 New Patient Office Visit  Subjective    Patient ID: Roger Gonzalez, male    DOB: [birth year]  Age: 58 y.o. MRN: [id number]  CC:  Chief Complaint  Patient presents with   Establish Care    Physical    HPI Roger Gonzalez presents to establish care with this practice. He is new to me.   HTN:  Taking losartan  50 mg daily. Well controlled. No chest pain or shortness of breath. Watches sodium and strenuous job.  Bipolar: Taking Lamictal  200 mg daily. Caplyta 42 mg daily Propranolol  10 mg as needed.  GERD: Pantoprazole  40 mg daily.  HSV 2 genital:  Valtrex  1000 mg daily.  Concussion: February, went to ED for work-up.  Gets headaches occasionally. Symptoms are managed when he gets enough rest.       Outpatient Encounter Medications as of 09/17/2023  Medication Sig   propranolol  (INDERAL ) 10 MG tablet Take 10 mg by mouth as needed.   atorvastatin  (LIPITOR) 10 MG tablet Take 1 tablet (10 mg total) by mouth daily. (Patient not taking: Reported on 02/14/2023)   celecoxib  (CELEBREX ) 200 MG capsule One to 2 tablets by mouth daily as needed for pain. (Patient not taking: Reported on 06/05/2023)   ibuprofen  (ADVIL ) 600 MG tablet Take 1 tablet (600 mg total) by mouth every 6 (six) hours as needed for headache.   lamoTRIgine  (LAMICTAL ) 200 MG tablet Take 200 mg by mouth daily.   losartan  (COZAAR ) 50 MG tablet TAKE 1 TABLET BY MOUTH EVERY DAY   lumateperone tosylate (CAPLYTA) 42 MG capsule Take 42 mg by mouth daily.   Multiple Vitamins-Minerals (ONE-A-DAY MENS 50+) TABS Take 1 tablet by mouth daily. (Patient not taking: Reported on 06/05/2023)   ondansetron  (ZOFRAN -ODT) 4 MG disintegrating tablet Take 1 tablet (4 mg total) by mouth every 8 (eight) hours as needed. (Patient not taking: Reported on 06/05/2023)   pantoprazole  (PROTONIX ) 40 MG tablet TAKE 1 TABLET BY MOUTH DAILY   tadalafil  (CIALIS ) 10 MG tablet Take 5 mg by mouth daily as needed for erectile dysfunction.    traZODone (DESYREL) 50 MG tablet Take 50 mg by mouth at bedtime. (Patient not taking: Reported on 06/05/2023)   valACYclovir  (VALTREX ) 1000 MG tablet TAKE 1 TABLET BY MOUTH DAILY   No facility-administered encounter medications on file as of 09/17/2023.    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   Qualifier: Diagnosis of  By: Roger Gonzalez      Past Surgical History:  Procedure Laterality Date   TONSILLECTOMY      Family History  Problem Relation Age of Onset   Hypertension Mother    Bipolar disorder Father     Social History   Socioeconomic History   Marital status: Significant Other    Spouse name: Not on file   Number of children: Not on file   Years of education: Not on file   Highest education level: Not on file  Occupational History   Not on file  Tobacco Use   Smoking status: Never   Smokeless tobacco: Not on file  Substance and Sexual Activity   Alcohol use: Not on file   Drug use: No   Sexual activity: Yes    Partners: Female    Birth control/protection: None  Other Topics Concern   Not on file  Social History Narrative   Not on  file   Social Drivers of Health   Financial Resource Strain: Not on file  Food Insecurity: Not on file  Transportation Needs: Not on file  Physical Activity: Not on file  Stress: Not on file  Social Connections: Unknown (06/01/2021)   [facility]   Social Network    Social Network: Not on file  Intimate Partner Violence: Unknown (04/24/2021)   Received from [facility]   HITS    Physically Hurt: Not on file    Insult or Talk Down To: Not on file    Threaten Physical Harm: Not on file    Scream or Curse: Not on file    ROS      Objective    BP 103/64   Pulse 63   Temp 98.2 F (36.8 C) (Oral)   Ht 5' 10 (1.778 m)   Wt 191 lb (86.6 kg)   SpO2 97%    BMI 27.41 kg/m   Physical Exam Vitals and nursing note reviewed.  Constitutional:      General: He is not in acute distress.    Appearance: Normal appearance.  Cardiovascular:     Rate and Rhythm: Regular rhythm.     Heart sounds: Normal heart sounds.  Pulmonary:     Effort: Pulmonary effort is normal.     Breath sounds: Normal breath sounds.  Skin:    General: Skin is warm and dry.  Neurological:     General: No focal deficit present.     Mental Status: He is alert. Mental status is at baseline.  Psychiatric:        Mood and Affect: Mood normal.        Behavior: Behavior normal.        Thought Content: Thought content normal.        Judgment: Judgment normal.         Assessment & Plan:   Problem List Items Addressed This Visit     Bipolar 1 disorder, mixed (HCC) (Chronic)   Taking Lamictal  200 mg daily. Caplyta 42 mg daily Propranolol  10 mg as needed. Managed by psychiatry.       Essential hypertension   Taking losartan  50 mg daily. Well controlled. No chest pain or shortness of breath. Watches sodium and strenuous job. No refills needed.       Relevant Medications   propranolol  (INDERAL ) 10 MG tablet   GERD (gastroesophageal reflux disease)   Pantoprazole  40 mg daily. Symptoms are well controlled. No refills needed.       Establishing care with new doctor, encounter for - Primary   HSV-2 infection   Valtrex  1000 mg daily. Has not had outbreak in some time.      History and medications reviewed.  Agrees with plan of care discussed.  Questions answered.   Return in about 2 weeks (around 10/01/2023) for CPE with labs.   Roger JONELLE Brownie, FNP

## 2023-09-24 ENCOUNTER — Encounter: Payer: Self-pay | Admitting: Sports Medicine

## 2023-09-26 ENCOUNTER — Other Ambulatory Visit: Payer: Self-pay | Admitting: Family Medicine

## 2023-09-26 NOTE — Telephone Encounter (Signed)
 Copied from CRM [id number]. Topic: Clinical - Medication Refill >> Sep 26, 2023  1:57 PM Roger Gonzalez wrote: Medication: pantoprazole  (PROTONIX ) 40 MG tablet  Has the patient contacted their pharmacy? Yes (Agent: If no, request that the patient contact the pharmacy for the refill. If patient does not wish to contact the pharmacy document the reason why and proceed with request.) (Agent: If yes, when and what did the pharmacy advise?)  This is the patient's preferred pharmacy:  [id number] - [place], [place] - [address] [place] [address] Phone: [phone] Fax: [phone]  Is this the correct pharmacy for this prescription? Yes If no, delete pharmacy and type the correct one.   Has the prescription been filled recently? No  Is the patient out of the medication? No  Has the patient been seen for an appointment in the last year OR does the patient have an upcoming appointment? Yes  Can we respond through MyChart? No  Agent: Please be advised that Rx refills may take up to 3 business days. We ask that you follow-up with your pharmacy.

## 2023-10-02 ENCOUNTER — Encounter: Payer: Self-pay | Admitting: Family Medicine

## 2023-10-02 ENCOUNTER — Ambulatory Visit (INDEPENDENT_AMBULATORY_CARE_PROVIDER_SITE_OTHER): Admitting: Family Medicine

## 2023-10-02 VITALS — BP 136/76 | HR 66 | Temp 98.3°F | Ht 70.0 in | Wt 194.0 lb

## 2023-10-02 DIAGNOSIS — K219 Gastro-esophageal reflux disease without esophagitis: Secondary | ICD-10-CM | POA: Diagnosis not present

## 2023-10-02 DIAGNOSIS — B009 Herpesviral infection, unspecified: Secondary | ICD-10-CM

## 2023-10-02 DIAGNOSIS — Z13228 Encounter for screening for other metabolic disorders: Secondary | ICD-10-CM

## 2023-10-02 DIAGNOSIS — Z130 Encounter for screening for diseases of the blood and blood-forming organs and certain disorders involving the immune mechanism: Secondary | ICD-10-CM

## 2023-10-02 DIAGNOSIS — I10 Essential (primary) hypertension: Secondary | ICD-10-CM

## 2023-10-02 DIAGNOSIS — Z0000 Encounter for general adult medical examination without abnormal findings: Secondary | ICD-10-CM

## 2023-10-02 DIAGNOSIS — Z13220 Encounter for screening for lipoid disorders: Secondary | ICD-10-CM

## 2023-10-02 DIAGNOSIS — Z136 Encounter for screening for cardiovascular disorders: Secondary | ICD-10-CM

## 2023-10-02 MED ORDER — VALACYCLOVIR HCL 1 G PO TABS: 1000.0000 mg | ORAL_TABLET | Freq: Every day | ORAL | 1 refills | Status: AC

## 2023-10-02 MED ORDER — LOSARTAN POTASSIUM 50 MG PO TABS: 50.0000 mg | ORAL_TABLET | Freq: Every day | ORAL | 1 refills | Status: AC

## 2023-10-02 MED ORDER — PANTOPRAZOLE SODIUM 40 MG PO TBEC: 40.0000 mg | DELAYED_RELEASE_TABLET | Freq: Every day | ORAL | 1 refills | Status: AC

## 2023-10-02 NOTE — Assessment & Plan Note (Signed)
 Symptoms are well controlled on current regimen. Refills sent.

## 2023-10-02 NOTE — Assessment & Plan Note (Signed)
 Losartan  50 mg daily. Blood pressure is well controlled. Refill sent. Follow-up in 6 months.

## 2023-10-02 NOTE — Assessment & Plan Note (Signed)
 Symptoms are controlled on current regimen. Refill sent.

## 2023-10-02 NOTE — Progress Notes (Signed)
 Complete physical exam  Patient: Roger Gonzalez   DOB: 1965/11/12   58 y.o. Male  MRN: [id number]  Subjective:    Chief Complaint  Patient presents with   Annual Exam    With Labs    SHAFTER JUPIN is a 58 y.o. male who presents today for a complete physical exam. He reports consuming a general diet. The patient has a physically strenuous job, but has no regular exercise apart from work.  He generally feels well. He reports sleeping fairly well. He does not have additional problems to discuss today.    Most recent fall risk assessment:    10/23/2022    2:22 PM  Fall Risk   Falls in the past year? 0  Number falls in past yr: 0  Injury with Fall? 0  Risk for fall due to : No Fall Risks  Follow up Falls evaluation completed     Most recent depression screenings:    09/17/2023    4:24 PM 12/22/2021   11:26 AM  PHQ 2/9 Scores  PHQ - 2 Score 0 0    Vision:Not within last year  and Dental: No current dental problems and No regular dental care     Patient Care Team: Booker Darice SAUNDERS, FNP as PCP - General (Family Medicine) Darden Planas, NP as Nurse Practitioner   Outpatient Medications Prior to Visit  Medication Sig   ibuprofen  (ADVIL ) 600 MG tablet Take 1 tablet (600 mg total) by mouth every 6 (six) hours as needed for headache.   lamoTRIgine  (LAMICTAL ) 200 MG tablet Take 200 mg by mouth daily.   lumateperone tosylate (CAPLYTA) 42 MG capsule Take 42 mg by mouth daily.   propranolol  (INDERAL ) 10 MG tablet Take 10 mg by mouth as needed.   tadalafil  (CIALIS ) 10 MG tablet Take 5 mg by mouth daily as needed for erectile dysfunction.   [DISCONTINUED] atorvastatin  (LIPITOR) 10 MG tablet Take 1 tablet (10 mg total) by mouth daily.   [DISCONTINUED] losartan  (COZAAR ) 50 MG tablet TAKE 1 TABLET BY MOUTH EVERY DAY   [DISCONTINUED] pantoprazole  (PROTONIX ) 40 MG tablet TAKE 1 TABLET BY MOUTH DAILY   [DISCONTINUED] valACYclovir  (VALTREX ) 1000 MG tablet TAKE 1 TABLET BY MOUTH DAILY    [DISCONTINUED] celecoxib  (CELEBREX ) 200 MG capsule One to 2 tablets by mouth daily as needed for pain. (Patient not taking: Reported on 06/05/2023)   [DISCONTINUED] Multiple Vitamins-Minerals (ONE-A-DAY MENS 50+) TABS Take 1 tablet by mouth daily. (Patient not taking: Reported on 06/05/2023)   [DISCONTINUED] ondansetron  (ZOFRAN -ODT) 4 MG disintegrating tablet Take 1 tablet (4 mg total) by mouth every 8 (eight) hours as needed. (Patient not taking: Reported on 06/05/2023)   [DISCONTINUED] traZODone (DESYREL) 50 MG tablet Take 50 mg by mouth at bedtime. (Patient not taking: Reported on 06/05/2023)   No facility-administered medications prior to visit.    ROS        Objective:     BP 136/76   Pulse 66   Temp 98.3 F (36.8 C) (Oral)   Ht 5' 10 (1.778 m)   Wt 194 lb (88 kg)   SpO2 95%   BMI 27.84 kg/m     Physical Exam Vitals and nursing note reviewed.  Constitutional:      General: He is not in acute distress.    Appearance: Normal appearance.  HENT:     Right Ear: Tympanic membrane normal.     Left Ear: Tympanic membrane normal.     Nose: Nose normal.  Mouth/Throat:     Mouth: Mucous membranes are moist.     Pharynx: Oropharynx is clear.  Eyes:     Extraocular Movements: Extraocular movements intact.  Neck:     Thyroid : No thyroid  tenderness.  Cardiovascular:     Rate and Rhythm: Normal rate and regular rhythm.     Pulses:          Radial pulses are 2+ on the right side and 2+ on the left side.     Heart sounds: Normal heart sounds, S1 normal and S2 normal.  Pulmonary:     Effort: Pulmonary effort is normal.     Breath sounds: Normal breath sounds.  Abdominal:     General: Bowel sounds are normal.     Palpations: Abdomen is soft.     Tenderness: There is no abdominal tenderness.  Musculoskeletal:        General: Normal range of motion.     Cervical back: Normal range of motion.     Right lower leg: No edema.     Left lower leg: No edema.  Lymphadenopathy:      Cervical:     Right cervical: No superficial cervical adenopathy.    Left cervical: No superficial cervical adenopathy.  Skin:    General: Skin is warm and dry.  Neurological:     General: No focal deficit present.     Mental Status: He is alert. Mental status is at baseline.  Psychiatric:        Mood and Affect: Mood normal.        Behavior: Behavior normal.        Thought Content: Thought content normal.        Judgment: Judgment normal.      No results found for any visits on 10/02/23.      Assessment & Plan:    Routine Health Maintenance and Physical Exam  Immunization History  Administered Date(s) Administered   Janssen (J&J) SARS-COV-2 Vaccination 04/05/2019   Td 03/22/2009   Tdap 06/29/2020    Health Maintenance  Topic Date Due   Hepatitis B Vaccines 19-59 Average Risk (1 of 3 - 19+ 3-dose series) Never done   Pneumococcal Vaccine: 50+ Years (1 of 1 - PCV) Never done   Zoster Vaccines- Shingrix (1 of 2) Never done   COVID-19 Vaccine (2 - 2025-26 season) 09/23/2023   Colonoscopy  10/23/2023 (Originally 12/02/2011)   Influenza Vaccine  04/21/2024 (Originally 08/23/2023)   Hepatitis C Screening  10/01/2024 (Originally 12/01/1984)   HIV Screening  10/01/2024 (Originally 12/01/1981)   Lung Cancer Screening  02/14/2024   DTaP/Tdap/Td (3 - Td or Tdap) 06/30/2030   HPV VACCINES  Aged Out   Meningococcal B Vaccine  Aged Out    Annual physical exam  Encounter for lipid screening for cardiovascular disease -     Lipid panel  Encounter for screening for metabolic disorder -     Comprehensive metabolic panel with GFR -     Hemoglobin A1c -     TSH + free T4  Screening for deficiency anemia -     CBC  Essential hypertension Assessment & Plan: Losartan  50 mg daily. Blood pressure is well controlled. Refill sent. Follow-up in 6 months.   Orders: -     Losartan  Potassium; Take 1 tablet (50 mg total) by mouth daily.  Dispense: 90 tablet; Refill:  1  Gastroesophageal reflux disease, unspecified whether esophagitis present Assessment & Plan: Symptoms are well controlled on current regimen. Refills sent.  Orders: -     Pantoprazole  Sodium; Take 1 tablet (40 mg total) by mouth daily.  Dispense: 90 tablet; Refill: 1  HSV-2 infection Assessment & Plan: Symptoms are controlled on current regimen. Refill sent.   Orders: -     valACYclovir  HCl; Take 1 tablet (1,000 mg total) by mouth daily.  Dispense: 90 tablet; Refill: 1      Routine labs ordered.  HCM reviewed/discussed.  Declines Hep C and HIV today.   Anticipatory guidance regarding healthy weight, lifestyle and choices given. Recommend healthy diet.  Recommend approximately 150 minutes/week of moderate intensity exercise. Resistance training is good for building muscles and for bone health. Muscle mass helps to increase our metabolism and to burn more calories at rest.  Limit alcohol consumption: no more than one drink per day for women and 2 drinks per day for me. Recommend regular dental and vision exams. Always use seatbelt/lap and shoulder restraints. Recommend using smoke alarms and checking batteries at least twice a year. Recommend using sunscreen when outside. Agrees with plan of care discussed.  Questions answered.      Return in about 1 year (around 10/02/2024) for CPE with labs.     Darice JONELLE Brownie, FNP

## 2023-10-03 ENCOUNTER — Telehealth: Payer: Self-pay

## 2023-10-03 NOTE — Telephone Encounter (Signed)
 Copied from CRM [id number]. Topic: General - Other >> Oct 03, 2023 10:48 AM Mercer PEDLAR wrote: Reason for CRM: Patient is requesting a callback from Angelica Littlejohn regarding how an appointment was scheduled.

## 2023-10-03 NOTE — Telephone Encounter (Signed)
 Copied from CRM #[id number]. Topic: General - Billing Inquiry >> Oct 02, 2023  3:14 PM Carrielelia G wrote: Please call pt Roger Gonzalez, he is questioning why for 10 years your office did not, charge or question him regarding lab charges, whereas [facility] is giving him estimates for labs.    Please advise.   Patient was also advise to call [facility].

## 2023-10-09 ENCOUNTER — Telehealth: Payer: Self-pay | Admitting: Family Medicine

## 2023-10-09 ENCOUNTER — Other Ambulatory Visit: Payer: Self-pay | Admitting: Family Medicine

## 2023-10-09 NOTE — Telephone Encounter (Signed)
 Copied from CRM #[id number]. Topic: General - Other >> Oct 09, 2023  1:14 PM Diannia H wrote: Reason for CRM: Patient is requesting a callback from Angelica Littlejohn regarding how an appointment was scheduled, please call patient on Monday if possible. Patients callback number is [phone].

## 2023-10-10 ENCOUNTER — Other Ambulatory Visit: Payer: Self-pay | Admitting: Family Medicine

## 2023-10-11 ENCOUNTER — Telehealth: Payer: Self-pay

## 2023-10-11 NOTE — Telephone Encounter (Signed)
 Copied from CRM #[id number]. Topic: Clinical - Prescription Issue >> Oct 11, 2023  4:13 PM Kevelyn M wrote: Reason for CRM: Patient calling in about status for sildenafil  (VIAGRA ) 100 MG tablet. Leaving for out of the country on Monday, Sept. 22, 2025.   [id number] [place],  - [address] [place] [address] Phone: [phone]  Fax: [phone]

## 2023-10-13 ENCOUNTER — Other Ambulatory Visit: Payer: Self-pay | Admitting: Family Medicine

## 2023-10-13 ENCOUNTER — Encounter: Payer: Self-pay | Admitting: Family Medicine

## 2023-10-13 MED ORDER — TADALAFIL 10 MG PO TABS: 5.0000 mg | ORAL_TABLET | Freq: Every day | ORAL | 0 refills | Status: DC | PRN

## 2023-10-14 ENCOUNTER — Other Ambulatory Visit: Payer: Self-pay | Admitting: Family Medicine

## 2023-10-14 MED ORDER — TADALAFIL 10 MG PO TABS: 5.0000 mg | ORAL_TABLET | Freq: Every day | ORAL | 0 refills | Status: DC | PRN

## 2023-10-14 NOTE — Telephone Encounter (Signed)
 Called Roger Gonzalez , I wanted to clarify with Clarita if it was billed for preventative care. Karen billed OV for annual exam so the patient should not have a copay. I will follow up with Roger. Gonzalez again once I talk to [facility].

## 2023-10-14 NOTE — Telephone Encounter (Signed)
 Prescription sent 9/21 to pharmacy requested. Today this pharmacy was changed to [facility]. Prescription sent to [facility]. No further prescriptions will be sent for this medication.    Copied from CRM [id number]. Topic: Clinical - Prescription Issue >> Oct 14, 2023 12:36 PM Aleatha C wrote: Reason for CRM: Patient has called to confirm that his medicaiton was not sent to [facility] [id number] [place], [place] - [address] [place] [address] Phone: [phone] Fax: [phone] Hours: Not open 24 hours  And it needs to be sent to [facility] today, he is going out of town tomorrow

## 2023-10-14 NOTE — Telephone Encounter (Signed)
 Called patient on 10/14/23

## 2023-10-15 ENCOUNTER — Telehealth: Payer: Self-pay

## 2023-10-15 ENCOUNTER — Other Ambulatory Visit: Payer: Self-pay

## 2023-10-15 DIAGNOSIS — N529 Male erectile dysfunction, unspecified: Secondary | ICD-10-CM

## 2023-10-15 MED ORDER — TADALAFIL 10 MG PO TABS: 5.0000 mg | ORAL_TABLET | Freq: Every day | ORAL | 0 refills | Status: DC | PRN

## 2023-10-15 NOTE — Telephone Encounter (Signed)
 Issue has been addressed in a separate note as below by a different CMA   Note Pt advised that note will be sent to provider to review request to refill Viargra Rx. Pt also advised that provider is out of office today and return tomorrow.    Pt verbalized that he was unhappy that Viagra  could not be refilled today. Patient wanted to know if he could change providers and go back over [facility]. Roger Gonzalez was told that only one Transfer of Care can be done in the department.

## 2023-10-15 NOTE — Telephone Encounter (Signed)
 Patient requested medication to be sent to [place]  [facility]. Medication was sent to pharmacy that he requested. Patient called back and stated medication was never sent to [facility] [address] in [place], This morning [facility] [address] [facility] [place], Patient then states that it was the wrong prescription that was sent and he is allergic to medication sent.   Please Advise    Copied from CRM #[id number]. Topic: Clinical - Prescription Issue >> Oct 15, 2023 10:06 AM Cherylann RAMAN wrote: Reason for CRM: Patient states the incorrect prescription was sent in. Patient states the medication is supposed to be sildenafil  (VIAGRA ) 100 MG tablet. However, this medication was discontinued per Dr. ONEIDA. Please tx to CAL for medical notes as to why the medication changed and a new prescription was given. >> Oct 15, 2023 10:19 AM Rosaria A wrote: Patient is calling back upset. Patient has missed his flight trying to get his medication. The wrong medication was sent in for the patient. Per patient he is allergic to tadalafil  (CIALIS ) 10 MG tablet. This medication was sent to the pharmacy today. Patient states that the medication that was supposed to be called in was sildenafil  (VIAGRA ) 100 MG tablet. Patient states that sildenafil  (VIAGRA ) 100 MG tablet should have been sent to [facility] [id number] [place], [place] - [address] [phone] Fax:[phone].  Patient is at the pharmacy now and would like for this medication to be called in so that he can go out of town. Please call patient back.

## 2023-10-15 NOTE — Telephone Encounter (Unsigned)
 Copied from CRM #[id number]. Topic: Complaint (DO NOT CONVERT) - Care >> Oct 15, 2023 10:19 AM Rosaria A wrote: Date of Incident: 10/15/2023 Details of complaint: Patient is very upset stating that the wrong medication was sent in for him. Patient states tadalafil  (CIALIS ) 10 MG tablet was sent to the Pharmacy and he has allergic reactions to this medication. Patient states that it should not still be on his chart and has spoken with his PCP to have this medication removed. How would the patient like to see it resolved? Patient would like to speak with the practice admin.  On a scale of 1-10, how was your experience? 1 What would it take to bring it to a 10? Patient would like to speak with the practice admin.   Route to Research officer, political party. >> Oct 15, 2023 11:07 AM Zane F wrote: Patient called back to speak with the Manager of the original complaint; Specialist informed the patient and he requested for the help of the specialist; The patient relayed he needs sildenafil  (VIAGRA ) 100 MG tablet not tadalafil  (CIALIS ) 10 MG tablet ; Patient relayed that the mentioned prescription makes his stomach hurt. Patient is requesting the prescription be sent to the below pharmacy.   CAL informed the specialist that the patient will have to wait on a response from the provider. It will take up to 48 hours due to all providers actively being in clinic.   Preferred Pharmacy:   Store [address] [place], [place], [place] [address] P: [phone]  Callback Number: [phone]

## 2023-10-15 NOTE — Telephone Encounter (Signed)
 Copied from CRM #[id number]. Topic: Clinical - Prescription Issue >> Oct 15, 2023 10:06 AM Cherylann RAMAN wrote: Reason for CRM: Patient states the incorrect prescription was sent in. Patient states the medication is supposed to be sildenafil  (VIAGRA ) 100 MG tablet. However, this medication was discontinued per Dr. ONEIDA. Please tx to CAL for medical notes as to why the medication changed and a new prescription was given. >> Oct 15, 2023 10:19 AM Rosaria A wrote: Patient is calling back upset. Patient has missed his flight trying to get his medication. The wrong medication was sent in for the patient. Per patient he is allergic to tadalafil  (CIALIS ) 10 MG tablet. This medication was sent to the pharmacy today. Patient states that the medication that was supposed to be called in was sildenafil  (VIAGRA ) 100 MG tablet. Patient states that sildenafil  (VIAGRA ) 100 MG tablet should have been sent to [facility] [id number] [place], [place] - [address] Phone:[phone] Fax:[phone].  Patient is at the pharmacy now and would like for this medication to be called in so that he can go out of town. Please call patient back.

## 2023-10-15 NOTE — Telephone Encounter (Signed)
 [facility] [address] in [place]. Verified prescription was received spoke with pharmacy tech [place]. Medication is ready for pickup   Copied from CRM [id number]. Topic: Clinical - Prescription Issue >> Oct 14, 2023 12:36 PM Aleatha C wrote: Reason for CRM: Patient has called to confirm that his medicaiton was not sent to [facility] [id number] [place], [place] - [address] [place] [address] Phone: [phone] Fax: [phone] Hours: Not open 24 hours  And it needs to be sent to [facility] today, he is going out of town tomorrow >> Oct 15, 2023  8:53 AM Antonio H wrote: Patient is calling back regarding medication. Informed patient that messages have been sent to provider. Patient is saying he needs medication sent to [facility] at [address] [place], [place] [address].  ASAP before he leaves for his flight, says [facility] is out of the way for him. Told patient to try contacting the pharmacy to see if they can transfer it since it is not a controlled substance. Unable to reach CAL.  >> Oct 15, 2023  8:32 AM Antwanette L wrote: Patient is requesting a new prescription for sildenafil  (VIAGRA ) 100 MG tablet to be sent to [facility] at [address] [place], [place] [address]. Patient is flying out within the next hour and requires the medication before departure.

## 2023-10-15 NOTE — Telephone Encounter (Signed)
 Pt advised that note will be sent to provider to review request to refill Viargra Rx. Pt also advised that provider is out of office today and return tomorrow.   Pt verbalized that he was unhappy that Viagra  could not be refilled today. Patient wanted to know if he could change providers and go back over [facility]. Mr. Shawler was told that only one Transfer of Care can be done in the department.

## 2023-10-16 ENCOUNTER — Other Ambulatory Visit: Payer: Self-pay | Admitting: Family Medicine

## 2023-10-16 ENCOUNTER — Encounter: Payer: Self-pay | Admitting: Family Medicine

## 2023-10-16 DIAGNOSIS — N529 Male erectile dysfunction, unspecified: Secondary | ICD-10-CM

## 2023-10-16 MED ORDER — SILDENAFIL CITRATE 100 MG PO TABS
ORAL_TABLET | ORAL | 0 refills | Status: AC
Start: 2023-10-16 — End: ?

## 2023-10-16 NOTE — Telephone Encounter (Signed)
 Left patient a voicemail stating that I got the lab order diagnosis corrected to reflect to Annual physical as primary diagnosis.

## 2023-10-16 NOTE — Telephone Encounter (Signed)
 10/16/2023-Left message on patients voicemail that Rx for Viagra  100 mg has been sent to his pharmacy per request.

## 2023-10-16 NOTE — Telephone Encounter (Signed)
 Copied from CRM [id number]. Topic: Clinical - Prescription Issue >> Oct 11, 2023  1:10 PM Gustabo D wrote: Roger Gonzalez he says it's the genric form of viagra  he needs it today he's getting ready to leave the country. Pt wants to know why it was refused.   Medication was sent in by his PCP. CRM was forwarded to wrong office.

## 2023-10-22 NOTE — Telephone Encounter (Signed)
 Noted

## 2023-11-02 ENCOUNTER — Other Ambulatory Visit: Payer: Self-pay | Admitting: Family Medicine

## 2023-11-02 DIAGNOSIS — N529 Male erectile dysfunction, unspecified: Secondary | ICD-10-CM

## 2023-11-13 ENCOUNTER — Telehealth: Payer: Self-pay

## 2023-11-13 NOTE — Telephone Encounter (Signed)
 Patient stopped by to see if you would please renew his medications. Wanted to stay ahead, so that he doesn't run out.

## 2023-11-14 NOTE — Telephone Encounter (Signed)
 Attempted to contact patient but unfortunately was sent to voicemail. I have left a voice message requesting a call back to our office.
